# Patient Record
Sex: Male | Born: 1980 | Race: Black or African American | Hispanic: No | Marital: Single | Smoking: Current every day smoker
Health system: Southern US, Community
[De-identification: ages and names within clinical notes are randomized; demographics above are authoritative.]

## PROBLEM LIST (undated history)

## (undated) DIAGNOSIS — E119 Type 2 diabetes mellitus without complications: Secondary | ICD-10-CM

## (undated) HISTORY — PX: NO PAST SURGERIES: SHX2092

---
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
## 2007-05-14 ENCOUNTER — Emergency Department (HOSPITAL_COMMUNITY): Admission: EM | Admit: 2007-05-14 | Discharge: 2007-05-14 | Payer: Self-pay | Admitting: Emergency Medicine

## 2008-05-10 ENCOUNTER — Emergency Department (HOSPITAL_COMMUNITY): Admission: EM | Admit: 2008-05-10 | Discharge: 2008-05-10 | Payer: Self-pay | Admitting: Emergency Medicine

## 2010-01-02 ENCOUNTER — Emergency Department (HOSPITAL_COMMUNITY): Admission: EM | Admit: 2010-01-02 | Discharge: 2010-01-02 | Payer: Self-pay | Admitting: Family Medicine

## 2010-01-09 ENCOUNTER — Emergency Department (HOSPITAL_COMMUNITY): Admission: EM | Admit: 2010-01-09 | Discharge: 2010-01-09 | Payer: Self-pay | Admitting: Family Medicine

## 2010-05-25 ENCOUNTER — Inpatient Hospital Stay (HOSPITAL_COMMUNITY): Admission: EM | Admit: 2010-05-25 | Discharge: 2010-05-26 | Payer: Self-pay | Admitting: Emergency Medicine

## 2010-12-16 LAB — URINALYSIS, ROUTINE W REFLEX MICROSCOPIC
Bilirubin Urine: NEGATIVE
Hgb urine dipstick: NEGATIVE
Nitrite: NEGATIVE
Protein, ur: NEGATIVE mg/dL
Urobilinogen, UA: 1 mg/dL (ref 0.0–1.0)

## 2010-12-16 LAB — HEPATIC FUNCTION PANEL
ALT: 23 U/L (ref 0–53)
Total Bilirubin: 0.9 mg/dL (ref 0.3–1.2)

## 2010-12-16 LAB — DIFFERENTIAL
Basophils Absolute: 0 10*3/uL (ref 0.0–0.1)
Basophils Absolute: 0.2 10*3/uL — ABNORMAL HIGH (ref 0.0–0.1)
Basophils Relative: 1 % (ref 0–1)
Eosinophils Absolute: 0 10*3/uL (ref 0.0–0.7)
Lymphs Abs: 0.6 10*3/uL — ABNORMAL LOW (ref 0.7–4.0)
Monocytes Absolute: 0.6 10*3/uL (ref 0.1–1.0)
Monocytes Relative: 3 % (ref 3–12)
Monocytes Relative: 5 % (ref 3–12)
Neutro Abs: 10.7 10*3/uL — ABNORMAL HIGH (ref 1.7–7.7)
Neutrophils Relative %: 88 % — ABNORMAL HIGH (ref 43–77)
Neutrophils Relative %: 89 % — ABNORMAL HIGH (ref 43–77)

## 2010-12-16 LAB — GLUCOSE, CAPILLARY
Glucose-Capillary: 118 mg/dL — ABNORMAL HIGH (ref 70–99)
Glucose-Capillary: 162 mg/dL — ABNORMAL HIGH (ref 70–99)
Glucose-Capillary: 221 mg/dL — ABNORMAL HIGH (ref 70–99)
Glucose-Capillary: 233 mg/dL — ABNORMAL HIGH (ref 70–99)
Glucose-Capillary: 242 mg/dL — ABNORMAL HIGH (ref 70–99)
Glucose-Capillary: 244 mg/dL — ABNORMAL HIGH (ref 70–99)
Glucose-Capillary: 54 mg/dL — ABNORMAL LOW (ref 70–99)

## 2010-12-16 LAB — CBC
HCT: 40 % (ref 39.0–52.0)
HCT: 45.9 % (ref 39.0–52.0)
Hemoglobin: 13.2 g/dL (ref 13.0–17.0)
Hemoglobin: 15.5 g/dL (ref 13.0–17.0)
MCH: 29.3 pg (ref 26.0–34.0)
MCHC: 33.1 g/dL (ref 30.0–36.0)
MCV: 86.4 fL (ref 78.0–100.0)
RBC: 4.61 MIL/uL (ref 4.22–5.81)
RBC: 5.31 MIL/uL (ref 4.22–5.81)

## 2010-12-16 LAB — CARDIAC PANEL(CRET KIN+CKTOT+MB+TROPI)
CK, MB: 3.2 ng/mL (ref 0.3–4.0)
CK, MB: 3.3 ng/mL (ref 0.3–4.0)
Relative Index: 0.5 (ref 0.0–2.5)
Relative Index: 0.5 (ref 0.0–2.5)
Total CK: 594 U/L — ABNORMAL HIGH (ref 7–232)

## 2010-12-16 LAB — URINE CULTURE

## 2010-12-16 LAB — BASIC METABOLIC PANEL
BUN: 6 mg/dL (ref 6–23)
CO2: 24 mEq/L (ref 19–32)
Chloride: 109 mEq/L (ref 96–112)
Creatinine, Ser: 1.09 mg/dL (ref 0.4–1.5)
GFR calc Af Amer: >60 mL/min (ref >60)
GFR calc non Af Amer: >60 mL/min (ref >60)
Glucose, Bld: 202 mg/dL — ABNORMAL HIGH (ref 70–99)
Potassium: 4.3 mEq/L (ref 3.5–5.1)
Sodium: 139 mEq/L (ref 135–145)

## 2010-12-16 LAB — CK TOTAL AND CKMB (NOT AT ARMC)
CK, MB: 4.4 ng/mL — ABNORMAL HIGH (ref 0.3–4.0)
Relative Index: 0.8 (ref 0.0–2.5)
Total CK: 563 U/L — ABNORMAL HIGH (ref 7–232)

## 2010-12-16 LAB — COMPREHENSIVE METABOLIC PANEL
ALT: 25 U/L (ref 0–53)
AST: 33 U/L (ref 0–37)
Alkaline Phosphatase: 70 U/L (ref 39–117)
BUN: 5 mg/dL — ABNORMAL LOW (ref 6–23)
Chloride: 111 mEq/L (ref 96–112)
Creatinine, Ser: 1.01 mg/dL (ref 0.4–1.5)
Creatinine, Ser: 1.06 mg/dL (ref 0.4–1.5)
GFR calc non Af Amer: >60 mL/min (ref >60)
Glucose, Bld: 138 mg/dL — ABNORMAL HIGH (ref 70–99)
Glucose, Bld: 262 mg/dL — ABNORMAL HIGH (ref 70–99)
Total Protein: 6.4 g/dL (ref 6.0–8.3)

## 2010-12-16 LAB — RAPID URINE DRUG SCREEN, HOSP PERFORMED
Barbiturates: NOT DETECTED
Cocaine: NOT DETECTED

## 2010-12-16 LAB — BLOOD GAS, ARTERIAL: Acid-base deficit: 0.1 mmol/L (ref 0.0–2.0)

## 2010-12-16 LAB — MRSA PCR SCREENING: MRSA by PCR: NEGATIVE

## 2011-06-13 IMAGING — CT CT HEAD W/O CM
1 series · 16 of 30 positions shown, 20 images · non-contrast
Comparison: None available.

CLINICAL DATA: Hypoglycemia.  Headache.  Nausea vomiting

CT HEAD WITHOUT CONTRAST
TECHNIQUE: Contiguous axial images were obtained from the base of
the skull through the vertex without contrast

[Series 2: head_seq 4.5 h37s st · axial · 0.43mm/px · z∈[-171,-27]mm · 16 of 36 slices shown, 20 images]
[im 2/36  brain]
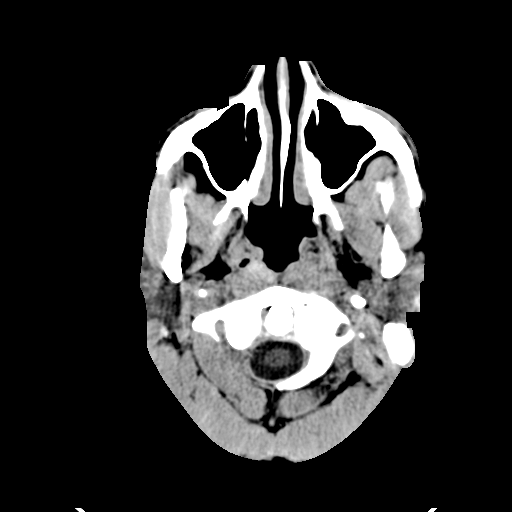
[im 2/36  bone]
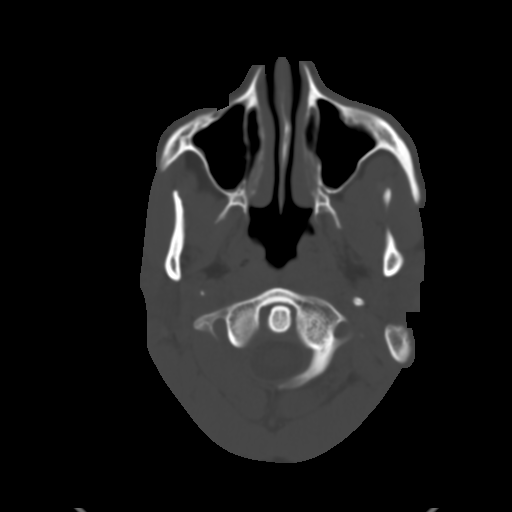
[im 4/36  brain]
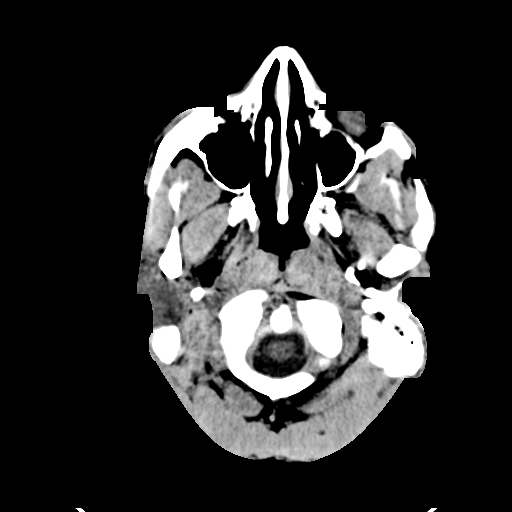
[im 7/36  brain]
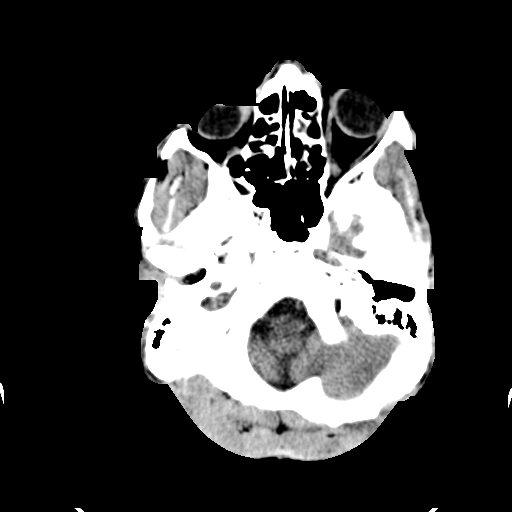
[im 9/36  brain]
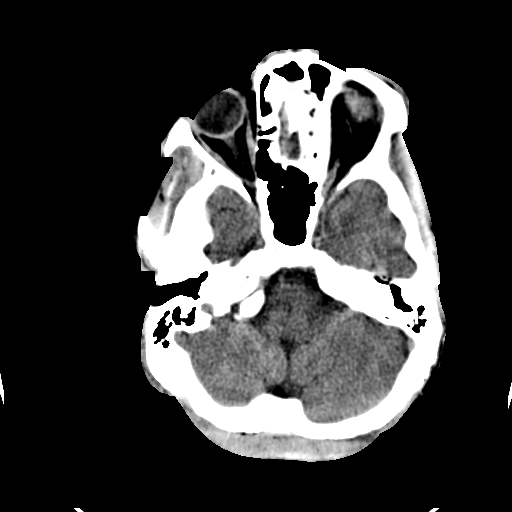
[im 10/36  brain]
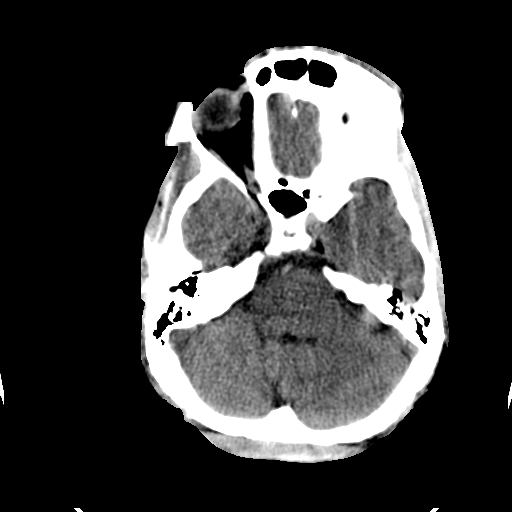
[im 10/36  bone]
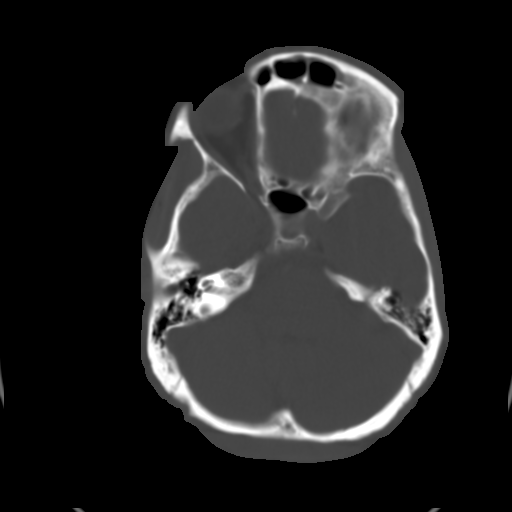
[im 13/36  brain]
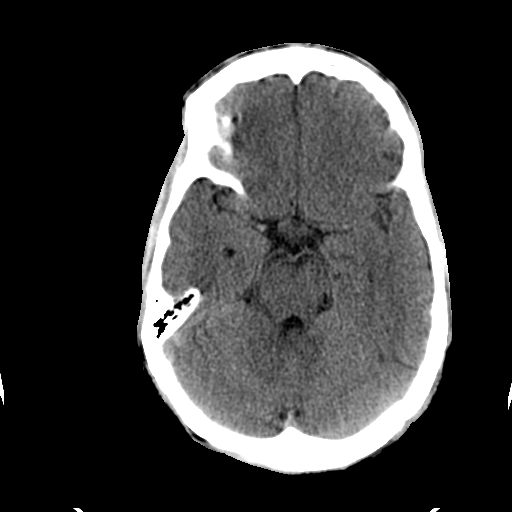
[im 15/36  brain]
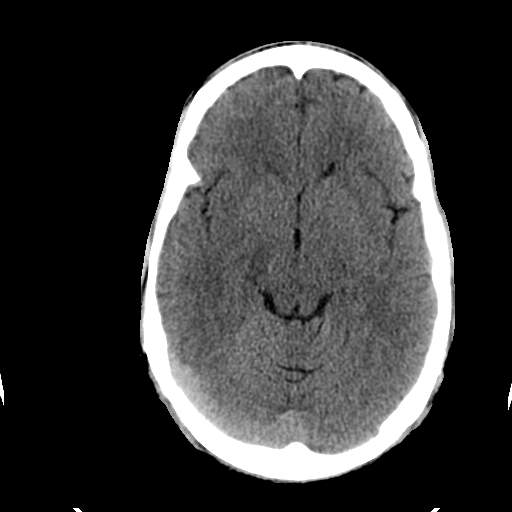
[im 17/36  brain]
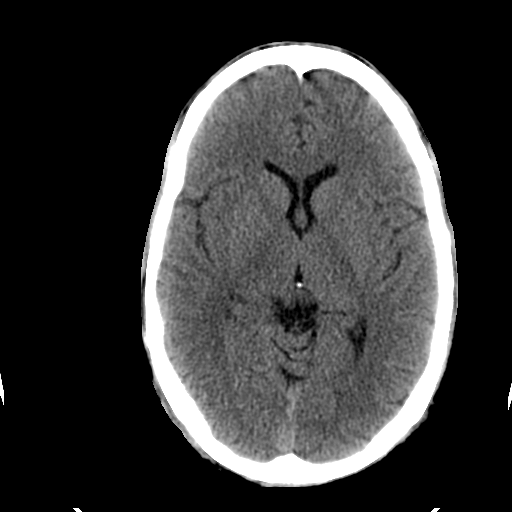
[im 19/36  brain]
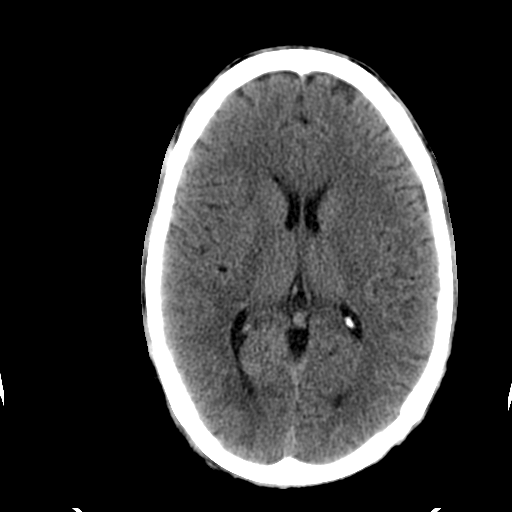
[im 19/36  bone]
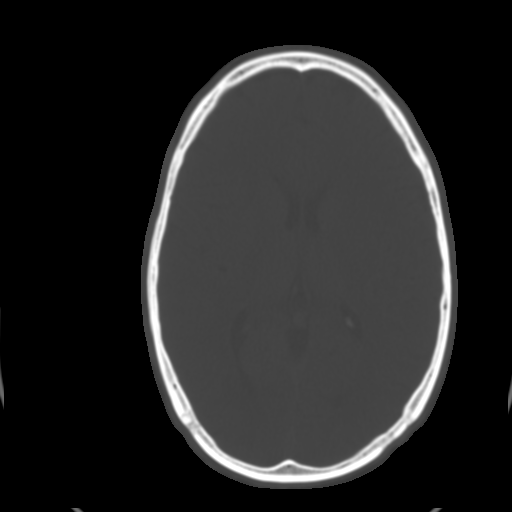
[im 21/36  brain]
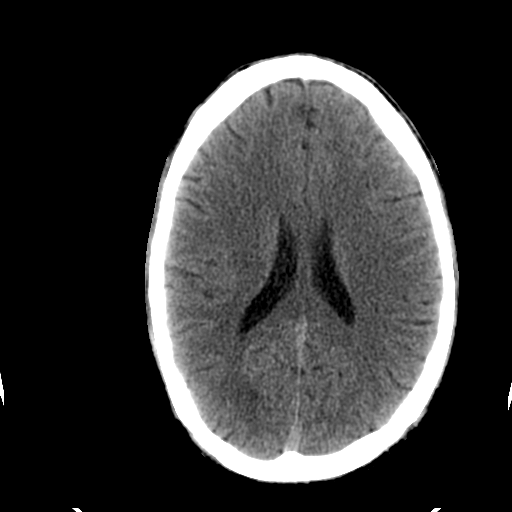
[im 23/36  brain]
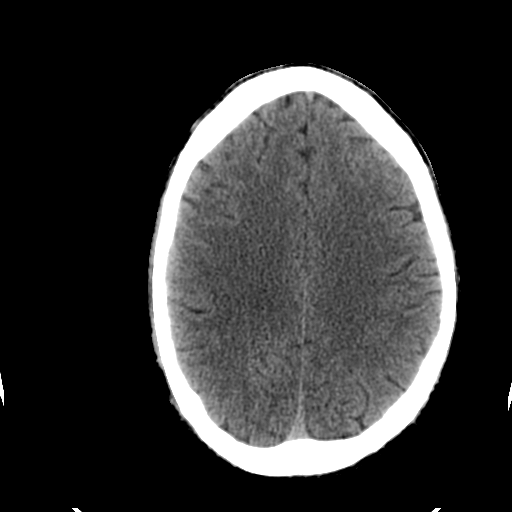
[im 26/36  brain]
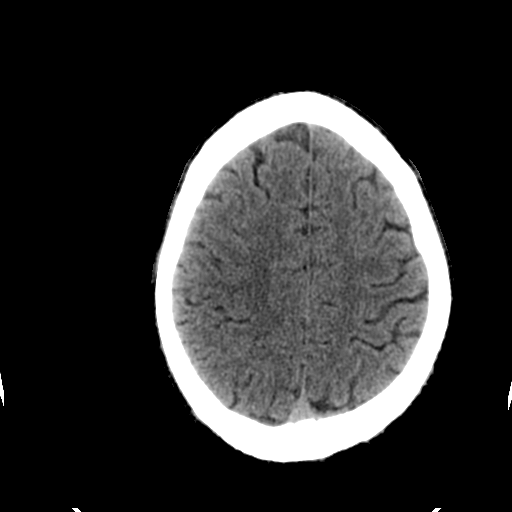
[im 27/36  brain]
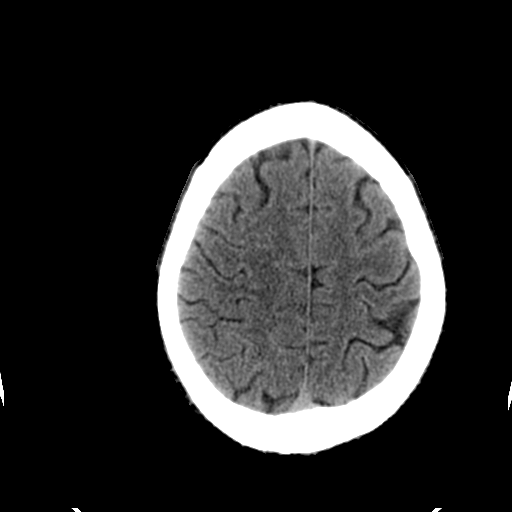
[im 27/36  bone]
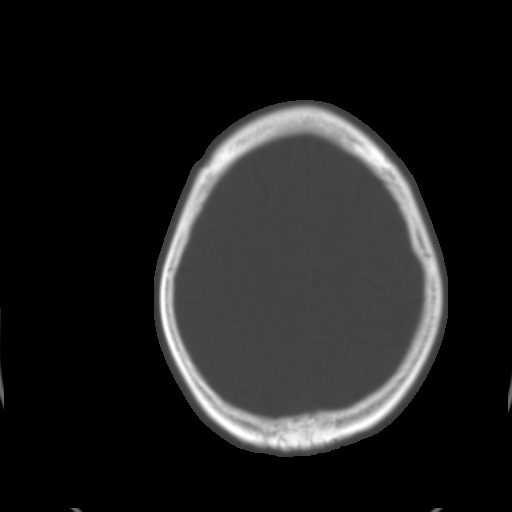
[im 29/36  brain]
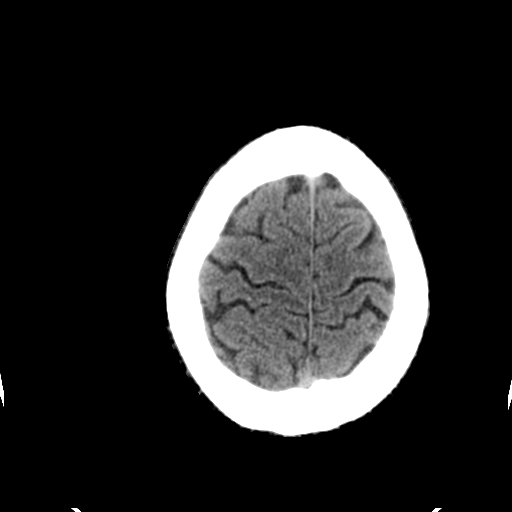
[im 32/36  brain]
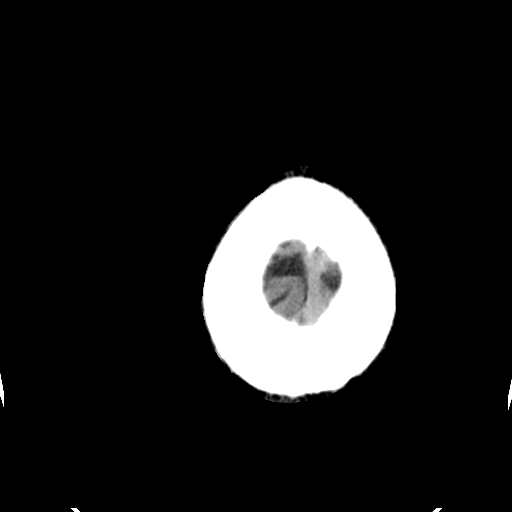
[im 34/36  brain]
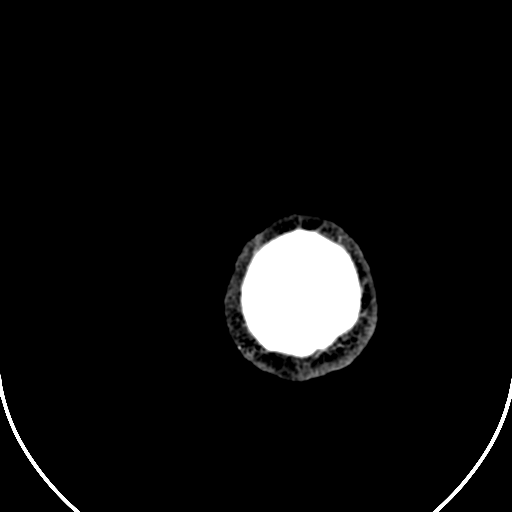

[16 of 30 positions shown; findings below may reference images not displayed]

FINDINGS: The brain has a normal appearance without evidence for
hemorrhage, acute infarction, hydrocephalus, or mass lesion.  There
is no extra axial fluid collection.  The skull and paranasal
sinuses are normal.
IMPRESSION: Normal CT of the head without contrast.

## 2011-06-30 LAB — BLOOD GAS, VENOUS
Acid-Base Excess: 0.1
O2 Saturation: 91.4
Patient temperature: 98.6
pO2, Ven: 61.7 — ABNORMAL HIGH

## 2011-06-30 LAB — GLUCOSE, CAPILLARY: Glucose-Capillary: 201 — ABNORMAL HIGH

## 2011-06-30 LAB — POCT I-STAT, CHEM 8
Calcium, Ion: 1.24
Chloride: 101
Glucose, Bld: 501
HCT: 41
TCO2: 25

## 2011-07-17 LAB — BLOOD GAS, ARTERIAL
Acid-base deficit: 3.2 — ABNORMAL HIGH
Bicarbonate: 20.6
O2 Saturation: 97.9
pO2, Arterial: 105 — ABNORMAL HIGH

## 2011-07-17 LAB — DIFFERENTIAL
Eosinophils Absolute: 0
Eosinophils Relative: 1
Lymphs Abs: 1.2
Monocytes Relative: 4

## 2011-07-17 LAB — BASIC METABOLIC PANEL
BUN: 9
Chloride: 94 — ABNORMAL LOW
GFR calc Af Amer: >60
Potassium: 4.1

## 2011-07-17 LAB — CBC
HCT: 44
MCV: 81.2
RBC: 5.42
WBC: 4.4

## 2014-06-26 ENCOUNTER — Encounter (HOSPITAL_COMMUNITY): Payer: Self-pay | Admitting: Emergency Medicine

## 2014-06-26 ENCOUNTER — Emergency Department (HOSPITAL_COMMUNITY)
Admission: EM | Admit: 2014-06-26 | Discharge: 2014-06-26 | Disposition: A | Discharge status: Home / Self Care | Payer: Self-pay | Attending: Emergency Medicine | Admitting: Emergency Medicine

## 2014-06-26 DIAGNOSIS — E119 Type 2 diabetes mellitus without complications: Secondary | ICD-10-CM | POA: Insufficient documentation

## 2014-06-26 DIAGNOSIS — K089 Disorder of teeth and supporting structures, unspecified: Secondary | ICD-10-CM | POA: Insufficient documentation

## 2014-06-26 DIAGNOSIS — K0889 Other specified disorders of teeth and supporting structures: Secondary | ICD-10-CM

## 2014-06-26 DIAGNOSIS — R51 Headache: Secondary | ICD-10-CM | POA: Insufficient documentation

## 2014-06-26 DIAGNOSIS — K029 Dental caries, unspecified: Secondary | ICD-10-CM | POA: Insufficient documentation

## 2014-06-26 DIAGNOSIS — F17200 Nicotine dependence, unspecified, uncomplicated: Secondary | ICD-10-CM | POA: Insufficient documentation

## 2014-06-26 HISTORY — DX: Type 2 diabetes mellitus without complications: E11.9

## 2014-06-26 MED ORDER — DOXYCYCLINE HYCLATE 100 MG PO CAPS: 100.0000 mg | ORAL_CAPSULE | Freq: Two times a day (BID) | ORAL | Status: DC

## 2014-06-26 MED ORDER — NAPROXEN 500 MG PO TABS: 500.0000 mg | ORAL_TABLET | Freq: Two times a day (BID) | ORAL | Status: DC | PRN

## 2014-06-26 MED ORDER — HYDROCODONE-ACETAMINOPHEN 5-325 MG PO TABS: 1.0000 | ORAL_TABLET | Freq: Four times a day (QID) | ORAL | Status: DC | PRN

## 2014-06-26 NOTE — ED Provider Notes (Signed)
CSN: [id number]     Arrival date & time 06/26/14  1225 History   First MD Initiated Contact with Patient 06/26/14 1243     Chief Complaint  Patient presents with  . Dental Pain     (Consider location/radiation/quality/duration/timing/severity/associated sxs/prior Treatment) HPI Comments: Ruben [name] is a 33 y.o. male with a PMHx of DM type 1, who presents to the ED today complaining of 3 days of gradual onset L lower dental pain in tooth #18. Reports her pain as 10/10 aching constant nonradiating worse with chewing and improved with approximately 12 Goody's powders packs per day. He states the pain was unrelieved with ibuprofen 800 mg and tramadol. Associated symptoms include some facial pain in this area, some swelling with purulent discharge yesterday, and mild headache. Denies fevers, chills, drooling, facial swelling, rhinorrhea, trismus, congestion, sore throat, difficulty swallowing, oral bleeding, trismus, ear pain/discharge, abd pain, n/v/d, paresthesias, weakness, vision changes, or facial droop. States 1 month ago his tooth broke, but he had no pain until 3 days ago. Made a dentist appt for 07/15/14. States his sugars have been "perfect, no highs or lows" and he's compliant with his insulin. Smokes daily. Has not seen a dentist in 1 yr.   Patient is a 33 y.o. male presenting with tooth pain. The history is provided by the patient. No language interpreter was used.  Dental Pain Location:  Lower Lower teeth location:  18/LL 2nd molar Quality:  Aching Severity:  Severe (10/10) Onset quality:  Gradual Duration:  3 days Timing:  Constant Progression:  Unchanged Chronicity:  New Context: dental caries and dental fracture   Relieved by:  NSAIDs (goody's powders) Worsened by:  Touching and pressure Ineffective treatments:  NSAIDs (ibuprofen , tramadol) Associated symptoms: facial pain, gum swelling, headaches and oral lesions   Associated symptoms: no congestion, no difficulty  swallowing, no drooling, no facial swelling, no fever, no neck pain, no neck swelling, no oral bleeding and no trismus   Risk factors: diabetes, lack of dental care, periodontal disease and smoking     Past Medical History  Diagnosis Date  . Diabetes mellitus without complication    History reviewed. No pertinent past surgical history. History reviewed. No pertinent family history. History  Substance Use Topics  . Smoking status: Current Every Day Smoker  . Smokeless tobacco: Not on file  . Alcohol Use: Yes    Review of Systems  Constitutional: Negative for fever and chills.  HENT: Positive for dental problem and mouth sores. Negative for congestion, drooling, ear discharge, ear pain, facial swelling, hearing loss, rhinorrhea, sinus pressure, sore throat, tinnitus and trouble swallowing.   Eyes: Negative for pain and visual disturbance.  Respiratory: Negative for cough and shortness of breath.   Cardiovascular: Negative for chest pain.  Gastrointestinal: Negative for nausea, vomiting, abdominal pain and diarrhea.  Musculoskeletal: Negative for arthralgias, neck pain and neck stiffness.  Skin: Negative for color change.  Neurological: Positive for headaches. Negative for dizziness, syncope, facial asymmetry, weakness, light-headedness and numbness.   10 Systems reviewed and are negative for acute change except as noted in the HPI.    Allergies  Review of patient's allergies indicates no known allergies.  Home Medications   Prior to Admission medications   Not on File   BP 136/83  Pulse 80  Temp(Src) 97.8 F (36.6 C) (Oral)  Resp 12  SpO2 100% Physical Exam  Nursing note and vitals reviewed. Constitutional: He is oriented to person, place, and time. Vital signs are  normal. He appears well-developed and well-nourished.  Non-toxic appearance. No distress.  VSS, afebrile, nontoxic  HENT:  Head: Normocephalic and atraumatic.  Nose: Nose normal.  Mouth/Throat: Uvula is  midline, oropharynx is clear and moist and mucous membranes are normal. No trismus in the jaw. Abnormal dentition. Dental caries present. No dental abscesses or uvula swelling. No oropharyngeal exudate.    Diffuse dental decay, teeth 17-19 fractured and decayed. L lower molar #18 TTP and with mild gingival swelling, no obvious abscess or drainage. No trismus or uvular swelling/deviation. Oropharynx clear without exudates or erythema. Swallowing secretions well, no drooling.  Eyes: Conjunctivae and EOM are normal. Pupils are equal, round, and reactive to light. Right eye exhibits no discharge. Left eye exhibits no discharge.  Neck: Normal range of motion. Neck supple.  Cardiovascular: Normal rate and intact distal pulses.   Pulmonary/Chest: Effort normal. No respiratory distress.  Abdominal: Normal appearance. He exhibits no distension.  Musculoskeletal: Normal range of motion.  Lymphadenopathy:       Head (left side): Submandibular adenopathy present.  Submandibular tender node just below affected tooth, mobile and <1cm  Neurological: He is alert and oriented to person, place, and time. No cranial nerve deficit (CN 5 and 7 intact).  Skin: Skin is warm, dry and intact. No rash noted.  Psychiatric: He has a normal mood and affect.    ED Course  Procedures (including critical care time) Labs Review Labs Reviewed - No data to display  Imaging Review No results found.   EKG Interpretation None      MDM   Final diagnoses:  Pain, dental  Dental decay    32y/o diabetic male with Dental pain associated with dental decay and possible dental abscess with patient afebrile, non toxic appearing and swallowing secretions well. Will treat with abx given that pt is diabetic and smoker, and has some LAD under affected tooth, do not want any possible underlying abscess to worsen. Doubt imaging would change my plan, therefore opted to not obtain dental xrays. Pt has appt with dentist on 07/15/14.  Opted against dental block. I stressed the importance of dental follow up for ultimate management of dental pain, and importance of not smoking.  I have also discussed reasons to return immediately to the ER.  Patient expresses understanding and agrees with plan.  I will also give doxycycline and pain control.   BP 136/83  Pulse 80  Temp(Src) 97.8 F (36.6 C) (Oral)  Resp 12  SpO2 100%  Meds ordered this encounter  Medications  . naproxen (NAPROSYN) 500 MG tablet    Sig: Take 1 tablet (500 mg total) by mouth 2 (two) times daily as needed for mild pain, moderate pain or headache (TAKE WITH MEALS.).    Dispense:  10 tablet    Refill:  0    Order Specific Question:  Supervising Provider    Answer:  Eber Hong D [[id number]]  . HYDROcodone-acetaminophen (NORCO) 5-325 MG per tablet    Sig: Take 1-2 tablets by mouth every 6 (six) hours as needed for severe pain.    Dispense:  10 tablet    Refill:  0    Order Specific Question:  Supervising Provider    Answer:  Eber Hong D [[id number]]  . doxycycline (VIBRAMYCIN) 100 MG capsule    Sig: Take 1 capsule (100 mg total) by mouth 2 (two) times daily. One po bid x 7 days    Dispense:  14 capsule    Refill:  0  Order Specific Question:  Supervising Provider    Answer:  Eber Hong D [id number] [place], PA-C 06/26/14 [phone]

## 2014-06-26 NOTE — Discharge Instructions (Signed)
Apply warm compresses to jaw throughout the day. Take antibiotic until finished and avoid sunlight while taking this medication as it can make your skin very sensitive to the sun. Take naprosyn and norco as directed, as needed for pain but do not drive or operate machinery with pain medication use. Followup with a dentist is very important for ongoing evaluation and management of recurrent dental pain. Return to emergency department for emergent changing or worsening symptoms.   Dental Caries Dental caries (also called tooth decay) is the most common oral disease. It can occur at any age but is more common in children and young adults.  HOW DENTAL CARIES DEVELOPS  The process of decay begins when bacteria and foods (particularly sugars and starches) combine in your mouth to produce plaque. Plaque is a substance that sticks to the hard, outer surface of a tooth (enamel). The bacteria in plaque produce acids that attack enamel. These acids may also attack the root surface of a tooth (cementum) if it is exposed. Repeated attacks dissolve these surfaces and create holes in the tooth (cavities). If left untreated, the acids destroy the other layers of the tooth.  RISK FACTORS  Frequent sipping of sugary beverages.   Frequent snacking on sugary and starchy foods, especially those that easily get stuck in the teeth.   Poor oral hygiene.   Dry mouth.   Substance abuse such as methamphetamine abuse.   Broken or poor-fitting dental restorations.   Eating disorders.   Gastroesophageal reflux disease (GERD).   Certain radiation treatments to the head and neck. SYMPTOMS In the early stages of dental caries, symptoms are seldom present. Sometimes white, chalky areas may be seen on the enamel or other tooth layers. In later stages, symptoms may include:  Pits and holes on the enamel.  Toothache after sweet, hot, or cold foods or drinks are consumed.  Pain around the tooth.  Swelling  around the tooth. DIAGNOSIS  Most of the time, dental caries is detected during a regular dental checkup. A diagnosis is made after a thorough medical and dental history is taken and the surfaces of your teeth are checked for signs of dental caries. Sometimes special instruments, such as lasers, are used to check for dental caries. Dental X-ray exams may be taken so that areas not visible to the eye (such as between the contact areas of the teeth) can be checked for cavities.  TREATMENT  If dental caries is in its early stages, it may be reversed with a fluoride treatment or an application of a remineralizing agent at the dental office. Thorough brushing and flossing at home is needed to aid these treatments. If it is in its later stages, treatment depends on the location and extent of tooth destruction:   If a small area of the tooth has been destroyed, the destroyed area will be removed and cavities will be filled with a material such as gold, silver amalgam, or composite resin.   If a large area of the tooth has been destroyed, the destroyed area will be removed and a cap (crown) will be fitted over the remaining tooth structure.   If the center part of the tooth (pulp) is affected, a procedure called a root canal will be needed before a filling or crown can be placed.   If most of the tooth has been destroyed, the tooth may need to be pulled (extracted). HOME CARE INSTRUCTIONS You can prevent, stop, or reverse dental caries at home by practicing good oral  hygiene. Good oral hygiene includes:  Thoroughly cleaning your teeth at least twice a day with a toothbrush and dental floss.   Using a fluoride toothpaste. A fluoride mouth rinse may also be used if recommended by your dentist or health care provider.   Restricting the amount of sugary and starchy foods and sugary liquids you consume.   Avoiding frequent snacking on these foods and sipping of these liquids.   Keeping regular  visits with a dentist for checkups and cleanings. PREVENTION   Practice good oral hygiene.  Consider a dental sealant. A dental sealant is a coating material that is applied by your dentist to the pits and grooves of teeth. The sealant prevents food from being trapped in them. It may protect the teeth for several years.  Ask about fluoride supplements if you live in a community without fluorinated water or with water that has a low fluoride content. Use fluoride supplements as directed by your dentist or health care provider.  Allow fluoride varnish applications to teeth if directed by your dentist or health care provider. Document Released: 06/10/2002 Document Revised: 02/02/2014 Document Reviewed: 09/20/2012 [facility] Patient Information 2015 [place], [place]. This information is not intended to replace advice given to you by your health care provider. Make sure you discuss any questions you have with your health care provider.  Dental Care and Dentist Visits Dental care supports good overall health. Regular dental visits can also help you avoid dental pain, bleeding, infection, and other more serious health problems in the future. It is important to keep the mouth healthy because diseases in the teeth, gums, and other oral tissues can spread to other areas of the body. Some problems, such as diabetes, heart disease, and pre-term labor have been associated with poor oral health.  See your dentist every 6 months. If you experience emergency problems such as a toothache or broken tooth, go to the dentist right away. If you see your dentist regularly, you may catch problems early. It is easier to be treated for problems in the early stages.  WHAT TO EXPECT AT A DENTIST VISIT  Your dentist will look for many common oral health problems and recommend proper treatment. At your regular dental visit, you can expect:  Gentle cleaning of the teeth and gums. This includes scraping and polishing. This helps  to remove the sticky substance around the teeth and gums (plaque). Plaque forms in the mouth shortly after eating. Over time, plaque hardens on the teeth as tartar. If tartar is not removed regularly, it can cause problems. Cleaning also helps remove stains.  Periodic X-rays. These pictures of the teeth and supporting bone will help your dentist assess the health of your teeth.  Periodic fluoride treatments. Fluoride is a natural mineral shown to help strengthen teeth. Fluoride treatmentinvolves applying a fluoride gel or varnish to the teeth. It is most commonly done in children.  Examination of the mouth, tongue, jaws, teeth, and gums to look for any oral health problems, such as:  Cavities (dental caries). This is decay on the tooth caused by plaque, sugar, and acid in the mouth. It is best to catch a cavity when it is small.  Inflammation of the gums caused by plaque buildup (gingivitis).  Problems with the mouth or malformed or misaligned teeth.  Oral cancer or other diseases of the soft tissues or jaws. KEEP YOUR TEETH AND GUMS HEALTHY For healthy teeth and gums, follow these general guidelines as well as your dentist's specific advice:  Have your teeth professionally cleaned at the dentist every 6 months.  Brush twice daily with a fluoride toothpaste.  Floss your teeth daily.  Ask your dentist if you need fluoride supplements, treatments, or fluoride toothpaste.  Eat a healthy diet. Reduce foods and drinks with added sugar.  Avoid smoking. TREATMENT FOR ORAL HEALTH PROBLEMS If you have oral health problems, treatment varies depending on the conditions present in your teeth and gums.  Your caregiver will most likely recommend good oral hygiene at each visit.  For cavities, gingivitis, or other oral health disease, your caregiver will perform a procedure to treat the problem. This is typically done at a separate appointment. Sometimes your caregiver will refer you to another  dental specialist for specific tooth problems or for surgery. SEEK IMMEDIATE DENTAL CARE IF:  You have pain, bleeding, or soreness in the gum, tooth, jaw, or mouth area.  A permanent tooth becomes loose or separated from the gum socket.  You experience a blow or injury to the mouth or jaw area. Document Released: 05/31/2011 Document Revised: 12/11/2011 Document Reviewed: 05/31/2011 [facility] Patient Information 2015 [place], [place]. This information is not intended to replace advice given to you by your health care provider. Make sure you discuss any questions you have with your health care provider.  Dental Pain A tooth ache may be caused by cavities (tooth decay). Cavities expose the nerve of the tooth to air and hot or cold temperatures. It may come from an infection or abscess (also called a boil or furuncle) around your tooth. It is also often caused by dental caries (tooth decay). This causes the pain you are having. DIAGNOSIS  Your caregiver can diagnose this problem by exam. TREATMENT   If caused by an infection, it may be treated with medications which kill germs (antibiotics) and pain medications as prescribed by your caregiver. Take medications as directed.  Only take over-the-counter or prescription medicines for pain, discomfort, or fever as directed by your caregiver.  Whether the tooth ache today is caused by infection or dental disease, you should see your dentist as soon as possible for further care. SEEK MEDICAL CARE IF: The exam and treatment you received today has been provided on an emergency basis only. This is not a substitute for complete medical or dental care. If your problem worsens or new problems (symptoms) appear, and you are unable to meet with your dentist, call or return to this location. SEEK IMMEDIATE MEDICAL CARE IF:   You have a fever.  You develop redness and swelling of your face, jaw, or neck.  You are unable to open your mouth.  You have severe  pain uncontrolled by pain medicine. MAKE SURE YOU:   Understand these instructions.  Will watch your condition.  Will get help right away if you are not doing well or get worse. Document Released: 09/18/2005 Document Revised: 12/11/2011 Document Reviewed: 05/06/2008 [facility] Patient Information 2015 [place], [place]. This information is not intended to replace advice given to you by your health care provider. Make sure you discuss any questions you have with your health care provider.

## 2014-06-26 NOTE — ED Notes (Signed)
Dental pain-lower left, 2nd from the back.

## 2014-06-26 NOTE — ED Notes (Signed)
Pt c/o tooth pain to lower left molar since Tuesday. Pt states that he has been taking Goody's powder with no relief. Pt reporting blurred vision and trouble sleeping due to pain. Multiple caries noted to lower teeth.

## 2014-06-26 NOTE — ED Provider Notes (Signed)
Medical screening examination/treatment/procedure(s) were performed by non-physician practitioner and as supervising physician I was immediately available for consultation/collaboration.    Vida Roller, MD 06/26/14 [phone]

## 2014-09-18 ENCOUNTER — Encounter (HOSPITAL_COMMUNITY): Payer: Self-pay | Admitting: Emergency Medicine

## 2014-09-18 ENCOUNTER — Emergency Department (HOSPITAL_COMMUNITY)
Admission: EM | Admit: 2014-09-18 | Discharge: 2014-09-19 | Disposition: A | Discharge status: Home / Self Care | Payer: Self-pay | Attending: Emergency Medicine | Admitting: Emergency Medicine

## 2014-09-18 DIAGNOSIS — E1165 Type 2 diabetes mellitus with hyperglycemia: Secondary | ICD-10-CM | POA: Insufficient documentation

## 2014-09-18 DIAGNOSIS — Z720 Tobacco use: Secondary | ICD-10-CM | POA: Insufficient documentation

## 2014-09-18 DIAGNOSIS — R739 Hyperglycemia, unspecified: Secondary | ICD-10-CM

## 2014-09-18 DIAGNOSIS — Z794 Long term (current) use of insulin: Secondary | ICD-10-CM

## 2014-09-18 DIAGNOSIS — R112 Nausea with vomiting, unspecified: Secondary | ICD-10-CM

## 2014-09-18 DIAGNOSIS — IMO0001 Reserved for inherently not codable concepts without codable children: Secondary | ICD-10-CM

## 2014-09-18 DIAGNOSIS — E119 Type 2 diabetes mellitus without complications: Secondary | ICD-10-CM

## 2014-09-18 LAB — COMPREHENSIVE METABOLIC PANEL
ALBUMIN: 4.3 g/dL (ref 3.5–5.2)
ALT: 46 U/L (ref 0–53)
ANION GAP: 18 — AB (ref 5–15)
AST: 34 U/L (ref 0–37)
Alkaline Phosphatase: 126 U/L — ABNORMAL HIGH (ref 39–117)
BUN: 7 mg/dL (ref 6–23)
CALCIUM: 9.7 mg/dL (ref 8.4–10.5)
CO2: 21 mEq/L (ref 19–32)
CREATININE: 0.74 mg/dL (ref 0.50–1.35)
Chloride: 95 mEq/L — ABNORMAL LOW (ref 96–112)
GFR calc Af Amer: >90 mL/min (ref >90)
GFR calc non Af Amer: >90 mL/min (ref >90)
Glucose, Bld: 313 mg/dL — ABNORMAL HIGH (ref 70–99)
Potassium: 3.4 mEq/L — ABNORMAL LOW (ref 3.7–5.3)
Sodium: 134 mEq/L — ABNORMAL LOW (ref 137–147)
Total Bilirubin: 0.6 mg/dL (ref 0.3–1.2)
Total Protein: 7.6 g/dL (ref 6.0–8.3)

## 2014-09-18 LAB — CBC
HEMATOCRIT: 41.9 % (ref 39.0–52.0)
Hemoglobin: 14.5 g/dL (ref 13.0–17.0)
MCH: 27.9 pg (ref 26.0–34.0)
MCHC: 34.6 g/dL (ref 30.0–36.0)
MCV: 80.6 fL (ref 78.0–100.0)
Platelets: 207 10*3/uL (ref 150–400)
RBC: 5.2 MIL/uL (ref 4.22–5.81)
RDW: 13.1 % (ref 11.5–15.5)
WBC: 11.1 10*3/uL — AB (ref 4.0–10.5)

## 2014-09-18 LAB — CBG MONITORING, ED: Glucose-Capillary: 309 mg/dL — ABNORMAL HIGH (ref 70–99)

## 2014-09-18 MED ORDER — INSULIN ASPART 100 UNIT/ML ~~LOC~~ SOLN
10.0000 [IU] | Freq: Once | SUBCUTANEOUS | Status: AC
  Administered 2014-09-18: 10 [IU] via SUBCUTANEOUS
  Filled 2014-09-18: qty 1

## 2014-09-18 MED ORDER — SODIUM CHLORIDE 0.9 % IV BOLUS (SEPSIS)
1000.0000 mL | Freq: Once | INTRAVENOUS | Status: AC
  Administered 2014-09-18: 1000 mL via INTRAVENOUS

## 2014-09-18 MED ORDER — ONDANSETRON HCL 4 MG/2ML IJ SOLN
4.0000 mg | Freq: Once | INTRAMUSCULAR | Status: AC
  Administered 2014-09-18: 4 mg via INTRAVENOUS
  Filled 2014-09-18: qty 2

## 2014-09-18 MED ORDER — PANTOPRAZOLE SODIUM 40 MG IV SOLR
40.0000 mg | Freq: Once | INTRAVENOUS | Status: AC
  Administered 2014-09-18: 40 mg via INTRAVENOUS
  Filled 2014-09-18: qty 40

## 2014-09-18 NOTE — ED Notes (Signed)
Pt. reports persistent nausea and emesis onset today , denies diarrhea , no fever or chills.

## 2014-09-18 NOTE — ED Provider Notes (Signed)
CSN: [id number]     Arrival date & time 09/18/14  2155 History   First MD Initiated Contact with Patient 09/18/14 2211     Chief Complaint  Patient presents with  . Emesis     (Consider location/radiation/quality/duration/timing/severity/associated sxs/prior Treatment) Patient is a 33 y.o. male presenting with vomiting. The history is provided by the patient.  Emesis Associated symptoms: no abdominal pain, no chills, no diarrhea, no headaches and no sore throat   pt with hx iddm, c/o recurrent nv today. Acute onset, episodic, several episodes emesis. Emesis not bloody or bilious. No diarrhea. Had normal bm yesterday. No abd distension or pain. No prior abd surgery. Denies fever or chills. No known bad food ingestion, recent travel, or ill contacts. Compliant w his insulin, no other med use.  States glucose in 300 range today. Denies polyuria or polydipsia. No dysuria or other gu c/o.      Past Medical History  Diagnosis Date  . Diabetes mellitus without complication    History reviewed. No pertinent past surgical history. No family history on file. History  Substance Use Topics  . Smoking status: Current Every Day Smoker  . Smokeless tobacco: Not on file  . Alcohol Use: Yes    Review of Systems  Constitutional: Negative for fever and chills.  HENT: Negative for sore throat.   Eyes: Negative for redness.  Respiratory: Negative for cough and shortness of breath.   Cardiovascular: Negative for chest pain.  Gastrointestinal: Positive for nausea and vomiting. Negative for abdominal pain, diarrhea and constipation.  Genitourinary: Negative for dysuria and flank pain.  Musculoskeletal: Negative for back pain and neck pain.  Skin: Negative for rash.  Neurological: Negative for syncope, light-headedness and headaches.  Hematological: Does not bruise/bleed easily.  Psychiatric/Behavioral: Negative for confusion.      Allergies  Review of patient's allergies indicates no known  allergies.  Home Medications   Prior to Admission medications   Medication Sig Start Date End Date Taking? Authorizing Provider  doxycycline (VIBRAMYCIN) 100 MG capsule Take 1 capsule (100 mg total) by mouth 2 (two) times daily. One po bid x 7 days 06/26/14   Donnita FallsMercedes Strupp Camprubi-Soms, PA-C  HYDROcodone-acetaminophen (NORCO) 5-325 MG per tablet Take 1-2 tablets by mouth every 6 (six) hours as needed for severe pain. 06/26/14   Mercedes Strupp Camprubi-Soms, PA-C  naproxen (NAPROSYN) 500 MG tablet Take 1 tablet (500 mg total) by mouth 2 (two) times daily as needed for mild pain, moderate pain or headache (TAKE WITH MEALS.). 06/26/14   Mercedes Strupp Camprubi-Soms, PA-C   BP 102/63 mmHg  Pulse 70  Temp(Src) 98.7 F (37.1 C) (Oral)  Resp 18  Ht 5\' 10"  (1.778 m)  Wt 150 lb (68.04 kg)  BMI 21.52 kg/m2  SpO2 100% Physical Exam  Constitutional: He is oriented to person, place, and time. He appears well-developed and well-nourished. No distress.  HENT:  Head: Atraumatic.  Mouth/Throat: Oropharynx is clear and moist.  Eyes: Conjunctivae are normal. No scleral icterus.  Neck: Neck supple. No tracheal deviation present.  Cardiovascular: Normal rate, regular rhythm, normal heart sounds and intact distal pulses.  Exam reveals no gallop and no friction rub.   No murmur heard. Pulmonary/Chest: Effort normal and breath sounds normal. No accessory muscle usage. No respiratory distress.  Abdominal: Soft. Bowel sounds are normal. He exhibits no distension and no mass. There is no tenderness. There is no rebound and no guarding.  No hernia  Genitourinary:  No cva tenderness  Musculoskeletal: Normal  range of motion. He exhibits no edema or tenderness.  Neurological: He is alert and oriented to person, place, and time.  Skin: Skin is warm and dry. No rash noted.  Psychiatric: He has a normal mood and affect.  Nursing note and vitals reviewed.   ED Course  Procedures (including critical care  time) Labs Review  Results for orders placed or performed during the hospital encounter of 09/18/14  Comprehensive metabolic panel  Result Value Ref Range   Sodium 134 (L) 137 - 147 mEq/L   Potassium 3.4 (L) 3.7 - 5.3 mEq/L   Chloride 95 (L) 96 - 112 mEq/L   CO2 21 19 - 32 mEq/L   Glucose, Bld 313 (H) 70 - 99 mg/dL   BUN 7 6 - 23 mg/dL   Creatinine, Ser 5.400.74 0.50 - 1.35 mg/dL   Calcium 9.7 8.4 - 98.110.5 mg/dL   Total Protein 7.6 6.0 - 8.3 g/dL   Albumin 4.3 3.5 - 5.2 g/dL   AST 34 0 - 37 U/L   ALT 46 0 - 53 U/L   Alkaline Phosphatase 126 (H) 39 - 117 U/L   Total Bilirubin 0.6 0.3 - 1.2 mg/dL   GFR calc non Af Amer >90 >90 mL/min   GFR calc Af Amer >90 >90 mL/min   Anion gap 18 (H) 5 - 15  CBC  Result Value Ref Range   WBC 11.1 (H) 4.0 - 10.5 K/uL   RBC 5.20 4.22 - 5.81 MIL/uL   Hemoglobin 14.5 13.0 - 17.0 g/dL   HCT 19.141.9 47.839.0 - 29.552.0 %   MCV 80.6 78.0 - 100.0 fL   MCH 27.9 26.0 - 34.0 pg   MCHC 34.6 30.0 - 36.0 g/dL   RDW 62.113.1 30.811.5 - 65.715.5 %   Platelets 207 150 - 400 K/uL     MDM   Iv ns bolus. zofran iv.  Reviewed nursing notes and prior charts for additional history.   Labs.  Pt drinking po fluids. No recurrent nv. abd soft nt.  2 liters ns in ed. subcut insulin.  Pt feels improved.  Pt appears stable for d/c.    Suzi RootsKevin E Amitai Delaughter, MD 09/18/14 [phone]

## 2014-09-19 NOTE — Discharge Instructions (Signed)
It was our pleasure to provide your ER care today - we hope that you feel better.  Rest. Drink plenty of fluids.  Continue insulin. Follow diabetic diet.  Follow up with primary care doctor, or here, in 1 days time for recheck if symptoms fail to improve/resolve.  Return to ER right away if worse, new symptoms, fevers, persistent vomiting, severe abdominal pain, other concern.    Nausea and Vomiting Nausea is a sick feeling that often comes before throwing up (vomiting). Vomiting is a reflex where stomach contents come out of your mouth. Vomiting can cause severe loss of body fluids (dehydration). Children and elderly adults can become dehydrated quickly, especially if they also have diarrhea. Nausea and vomiting are symptoms of a condition or disease. It is important to find the cause of your symptoms. CAUSES   Direct irritation of the stomach lining. This irritation can result from increased acid production (gastroesophageal reflux disease), infection, food poisoning, taking certain medicines (such as nonsteroidal anti-inflammatory drugs), alcohol use, or tobacco use.  Signals from the brain.These signals could be caused by a headache, heat exposure, an inner ear disturbance, increased pressure in the brain from injury, infection, a tumor, or a concussion, pain, emotional stimulus, or metabolic problems.  An obstruction in the gastrointestinal tract (bowel obstruction).  Illnesses such as diabetes, hepatitis, gallbladder problems, appendicitis, kidney problems, cancer, sepsis, atypical symptoms of a heart attack, or eating disorders.  Medical treatments such as chemotherapy and radiation.  Receiving medicine that makes you sleep (general anesthetic) during surgery. DIAGNOSIS Your caregiver may ask for tests to be done if the problems do not improve after a few days. Tests may also be done if symptoms are severe or if the reason for the nausea and vomiting is not clear. Tests may  include:  Urine tests.  Blood tests.  Stool tests.  Cultures (to look for evidence of infection).  X-rays or other imaging studies. Test results can help your caregiver make decisions about treatment or the need for additional tests. TREATMENT You need to stay well hydrated. Drink frequently but in small amounts.You may wish to drink water, sports drinks, clear broth, or eat frozen ice pops or gelatin dessert to help stay hydrated.When you eat, eating slowly may help prevent nausea.There are also some antinausea medicines that may help prevent nausea. HOME CARE INSTRUCTIONS   Take all medicine as directed by your caregiver.  If you do not have an appetite, do not force yourself to eat. However, you must continue to drink fluids.  If you have an appetite, eat a normal diet unless your caregiver tells you differently.  Eat a variety of complex carbohydrates (rice, wheat, potatoes, bread), lean meats, yogurt, fruits, and vegetables.  Avoid high-fat foods because they are more difficult to digest.  Drink enough water and fluids to keep your urine clear or pale yellow.  If you are dehydrated, ask your caregiver for specific rehydration instructions. Signs of dehydration may include:  Severe thirst.  Dry lips and mouth.  Dizziness.  Dark urine.  Decreasing urine frequency and amount.  Confusion.  Rapid breathing or pulse. SEEK IMMEDIATE MEDICAL CARE IF:   You have blood or brown flecks (like coffee grounds) in your vomit.  You have black or bloody stools.  You have a severe headache or stiff neck.  You are confused.  You have severe abdominal pain.  You have chest pain or trouble breathing.  You do not urinate at least once every 8 hours.  You  develop cold or clammy skin.  You continue to vomit for longer than 24 to 48 hours.  You have a fever. MAKE SURE YOU:   Understand these instructions.  Will watch your condition.  Will get help right away if  you are not doing well or get worse. Document Released: 09/18/2005 Document Revised: 12/11/2011 Document Reviewed: 02/15/2011 [facility] Patient Information 2015 [place], [place]. This information is not intended to replace advice given to you by your health care provider. Make sure you discuss any questions you have with your health care provider.    Hyperglycemia Hyperglycemia occurs when the glucose (sugar) in your blood is too high. Hyperglycemia can happen for many reasons, but it most often happens to people who do not know they have diabetes or are not managing their diabetes properly.  CAUSES  Whether you have diabetes or not, there are other causes of hyperglycemia. Hyperglycemia can occur when you have diabetes, but it can also occur in other situations that you might not be as aware of, such as: Diabetes  If you have diabetes and are having problems controlling your blood glucose, hyperglycemia could occur because of some of the following reasons:  Not following your meal plan.  Not taking your diabetes medications or not taking it properly.  Exercising less or doing less activity than you normally do.  Being sick. Pre-diabetes  This cannot be ignored. Before people develop Type 2 diabetes, they almost always have "pre-diabetes." This is when your blood glucose levels are higher than normal, but not yet high enough to be diagnosed as diabetes. Research has shown that some long-term damage to the body, especially the heart and circulatory system, may already be occurring during pre-diabetes. If you take action to manage your blood glucose when you have pre-diabetes, you may delay or prevent Type 2 diabetes from developing. Stress  If you have diabetes, you may be "diet" controlled or on oral medications or insulin to control your diabetes. However, you may find that your blood glucose is higher than usual in the hospital whether you have diabetes or not. This is often referred to as  "stress hyperglycemia." Stress can elevate your blood glucose. This happens because of hormones put out by the body during times of stress. If stress has been the cause of your high blood glucose, it can be followed regularly by your caregiver. That way he/she can make sure your hyperglycemia does not continue to get worse or progress to diabetes. Steroids  Steroids are medications that act on the infection fighting system (immune system) to block inflammation or infection. One side effect can be a rise in blood glucose. Most people can produce enough extra insulin to allow for this rise, but for those who cannot, steroids make blood glucose levels go even higher. It is not unusual for steroid treatments to "uncover" diabetes that is developing. It is not always possible to determine if the hyperglycemia will go away after the steroids are stopped. A special blood test called an A1c is sometimes done to determine if your blood glucose was elevated before the steroids were started. SYMPTOMS  Thirsty.  Frequent urination.  Dry mouth.  Blurred vision.  Tired or fatigue.  Weakness.  Sleepy.  Tingling in feet or leg. DIAGNOSIS  Diagnosis is made by monitoring blood glucose in one or all of the following ways:  A1c test. This is a chemical found in your blood.  Fingerstick blood glucose monitoring.  Laboratory results. TREATMENT  First, knowing the cause  of the hyperglycemia is important before the hyperglycemia can be treated. Treatment may include, but is not be limited to:  Education.  Change or adjustment in medications.  Change or adjustment in meal plan.  Treatment for an illness, infection, etc.  More frequent blood glucose monitoring.  Change in exercise plan.  Decreasing or stopping steroids.  Lifestyle changes. HOME CARE INSTRUCTIONS   Test your blood glucose as directed.  Exercise regularly. Your caregiver will give you instructions about exercise. Pre-diabetes  or diabetes which comes on with stress is helped by exercising.  Eat wholesome, balanced meals. Eat often and at regular, fixed times. Your caregiver or nutritionist will give you a meal plan to guide your sugar intake.  Being at an ideal weight is important. If needed, losing as little as 10 to 15 pounds may help improve blood glucose levels. SEEK MEDICAL CARE IF:   You have questions about medicine, activity, or diet.  You continue to have symptoms (problems such as increased thirst, urination, or weight gain). SEEK IMMEDIATE MEDICAL CARE IF:   You are vomiting or have diarrhea.  Your breath smells fruity.  You are breathing faster or slower.  You are very sleepy or incoherent.  You have numbness, tingling, or pain in your feet or hands.  You have chest pain.  Your symptoms get worse even though you have been following your caregiver's orders.  If you have any other questions or concerns. Document Released: 03/14/2001 Document Revised: 12/11/2011 Document Reviewed: 01/15/2012 [facility] Patient Information 2015 [place]. This information is not intended to replace advice given to you by your health care provider. Make sure you discuss any questions you have with your health care provider.   Diabetes Mellitus and Food It is important for you to manage your blood sugar (glucose) level. Your blood glucose level can be greatly affected by what you eat. Eating healthier foods in the appropriate amounts throughout the day at about the same time each day will help you control your blood glucose level. It can also help slow or prevent worsening of your diabetes mellitus. Healthy eating may even help you improve the level of your blood pressure and reach or maintain a healthy weight.  HOW CAN FOOD AFFECT ME? Carbohydrates Carbohydrates affect your blood glucose level more than any other type of food. Your dietitian will help you determine how many carbohydrates to eat at each meal  and teach you how to count carbohydrates. Counting carbohydrates is important to keep your blood glucose at a healthy level, especially if you are using insulin or taking certain medicines for diabetes mellitus. Alcohol Alcohol can cause sudden decreases in blood glucose (hypoglycemia), especially if you use insulin or take certain medicines for diabetes mellitus. Hypoglycemia can be a life-threatening condition. Symptoms of hypoglycemia (sleepiness, dizziness, and disorientation) are similar to symptoms of having too much alcohol.  If your health care provider has given you approval to drink alcohol, do so in moderation and use the following guidelines:  Women should not have more than one drink per day, and men should not have more than two drinks per day. One drink is equal to:  12 oz of beer.  5 oz of wine.  1 oz of hard liquor.  Do not drink on an empty stomach.  Keep yourself hydrated. Have water, diet soda, or unsweetened iced tea.  Regular soda, juice, and other mixers might contain a lot of carbohydrates and should be counted. WHAT FOODS ARE NOT RECOMMENDED? As you  make food choices, it is important to remember that all foods are not the same. Some foods have fewer nutrients per serving than other foods, even though they might have the same number of calories or carbohydrates. It is difficult to get your body what it needs when you eat foods with fewer nutrients. Examples of foods that you should avoid that are high in calories and carbohydrates but low in nutrients include:  Trans fats (most processed foods list trans fats on the Nutrition Facts label).  Regular soda.  Juice.  Candy.  Sweets, such as cake, pie, doughnuts, and cookies.  Fried foods. WHAT FOODS CAN I EAT? Have nutrient-rich foods, which will nourish your body and keep you healthy. The food you should eat also will depend on several factors, including:  The calories you need.  The medicines you  take.  Your weight.  Your blood glucose level.  Your blood pressure level.  Your cholesterol level. You also should eat a variety of foods, including:  Protein, such as meat, poultry, fish, tofu, nuts, and seeds (lean animal proteins are best).  Fruits.  Vegetables.  Dairy products, such as milk, cheese, and yogurt (low fat is best).  Breads, grains, pasta, cereal, rice, and beans.  Fats such as olive oil, trans fat-free margarine, canola oil, avocado, and olives. DOES EVERYONE WITH DIABETES MELLITUS HAVE THE SAME MEAL PLAN? Because every person with diabetes mellitus is different, there is not one meal plan that works for everyone. It is very important that you meet with a dietitian who will help you create a meal plan that is just right for you. Document Released: 06/15/2005 Document Revised: 09/23/2013 Document Reviewed: 08/15/2013 [facility] Patient Information 2015 [place], [place]. This information is not intended to replace advice given to you by your health care provider. Make sure you discuss any questions you have with your health care provider.

## 2014-09-19 NOTE — ED Notes (Signed)
Pt. Left with all belongings 

## 2014-11-21 ENCOUNTER — Inpatient Hospital Stay (HOSPITAL_COMMUNITY)
Admission: EM | Admit: 2014-11-21 | Discharge: 2014-11-23 | DRG: 638 | Disposition: A | Discharge status: Home / Self Care | Payer: Self-pay | Attending: Internal Medicine | Admitting: Internal Medicine

## 2014-11-21 ENCOUNTER — Encounter (HOSPITAL_COMMUNITY): Payer: Self-pay | Admitting: Adult Health

## 2014-11-21 ENCOUNTER — Inpatient Hospital Stay (HOSPITAL_COMMUNITY): Payer: Self-pay

## 2014-11-21 DIAGNOSIS — N179 Acute kidney failure, unspecified: Secondary | ICD-10-CM | POA: Diagnosis present

## 2014-11-21 DIAGNOSIS — R112 Nausea with vomiting, unspecified: Secondary | ICD-10-CM | POA: Diagnosis present

## 2014-11-21 DIAGNOSIS — R0602 Shortness of breath: Secondary | ICD-10-CM

## 2014-11-21 DIAGNOSIS — Z720 Tobacco use: Secondary | ICD-10-CM | POA: Diagnosis present

## 2014-11-21 DIAGNOSIS — E1110 Type 2 diabetes mellitus with ketoacidosis without coma: Secondary | ICD-10-CM | POA: Diagnosis present

## 2014-11-21 DIAGNOSIS — E1310 Other specified diabetes mellitus with ketoacidosis without coma: Secondary | ICD-10-CM

## 2014-11-21 DIAGNOSIS — E1010 Type 1 diabetes mellitus with ketoacidosis without coma: Principal | ICD-10-CM | POA: Diagnosis present

## 2014-11-21 DIAGNOSIS — F17210 Nicotine dependence, cigarettes, uncomplicated: Secondary | ICD-10-CM | POA: Diagnosis present

## 2014-11-21 DIAGNOSIS — Z794 Long term (current) use of insulin: Secondary | ICD-10-CM

## 2014-11-21 DIAGNOSIS — E860 Dehydration: Secondary | ICD-10-CM | POA: Diagnosis present

## 2014-11-21 DIAGNOSIS — D72829 Elevated white blood cell count, unspecified: Secondary | ICD-10-CM | POA: Diagnosis present

## 2014-11-21 LAB — COMPREHENSIVE METABOLIC PANEL
ALBUMIN: 4.5 g/dL (ref 3.5–5.2)
ALT: 50 U/L (ref 0–53)
AST: 37 U/L (ref 0–37)
Alkaline Phosphatase: 109 U/L (ref 39–117)
Anion gap: 21 — ABNORMAL HIGH (ref 5–15)
BUN: 18 mg/dL (ref 6–23)
CO2: 14 mmol/L — AB (ref 19–32)
Calcium: 9.7 mg/dL (ref 8.4–10.5)
Chloride: 99 mmol/L (ref 96–112)
Creatinine, Ser: 1.4 mg/dL — ABNORMAL HIGH (ref 0.50–1.35)
GFR, EST AFRICAN AMERICAN: 76 mL/min — AB (ref >90)
GFR, EST NON AFRICAN AMERICAN: 66 mL/min — AB (ref >90)
Glucose, Bld: 285 mg/dL — ABNORMAL HIGH (ref 70–99)
POTASSIUM: 4.5 mmol/L (ref 3.5–5.1)
SODIUM: 134 mmol/L — AB (ref 135–145)
Total Bilirubin: 1.6 mg/dL — ABNORMAL HIGH (ref 0.3–1.2)
Total Protein: 7.7 g/dL (ref 6.0–8.3)

## 2014-11-21 LAB — URINALYSIS, ROUTINE W REFLEX MICROSCOPIC
BILIRUBIN URINE: NEGATIVE
Hgb urine dipstick: NEGATIVE
Ketones, ur: >80 mg/dL — AB
Leukocytes, UA: NEGATIVE
NITRITE: NEGATIVE
Protein, ur: NEGATIVE mg/dL
SPECIFIC GRAVITY, URINE: 1.029 (ref 1.005–1.030)
Urobilinogen, UA: 0.2 mg/dL (ref 0.0–1.0)
pH: 5 (ref 5.0–8.0)

## 2014-11-21 LAB — RAPID URINE DRUG SCREEN, HOSP PERFORMED
Amphetamines: NOT DETECTED
BENZODIAZEPINES: NOT DETECTED
Barbiturates: NOT DETECTED
Cocaine: NOT DETECTED
Opiates: NOT DETECTED
Tetrahydrocannabinol: NOT DETECTED

## 2014-11-21 LAB — TSH: TSH: 1.406 u[IU]/mL (ref 0.350–4.500)

## 2014-11-21 LAB — CBC
HEMATOCRIT: 46.2 % (ref 39.0–52.0)
HEMOGLOBIN: 16.2 g/dL (ref 13.0–17.0)
MCH: 29.6 pg (ref 26.0–34.0)
MCHC: 35.1 g/dL (ref 30.0–36.0)
MCV: 84.5 fL (ref 78.0–100.0)
PLATELETS: 99 10*3/uL — AB (ref 150–400)
RBC: 5.47 MIL/uL (ref 4.22–5.81)
RDW: 13.5 % (ref 11.5–15.5)
WBC: 19.4 10*3/uL — AB (ref 4.0–10.5)

## 2014-11-21 LAB — CBG MONITORING, ED
Glucose-Capillary: 237 mg/dL — ABNORMAL HIGH (ref 70–99)
Glucose-Capillary: 229 mg/dL — ABNORMAL HIGH (ref 70–99)

## 2014-11-21 LAB — URINE MICROSCOPIC-ADD ON: URINE-OTHER: NONE SEEN

## 2014-11-21 LAB — GLUCOSE, CAPILLARY: Glucose-Capillary: 311 mg/dL — ABNORMAL HIGH (ref 70–99)

## 2014-11-21 LAB — D-DIMER, QUANTITATIVE (NOT AT ARMC): D DIMER QUANT: 0.28 ug{FEU}/mL (ref 0.00–0.48)

## 2014-11-21 MED ORDER — SODIUM CHLORIDE 0.9 % IV SOLN: INTRAVENOUS | Status: DC

## 2014-11-21 MED ORDER — SODIUM CHLORIDE 0.9 % IV SOLN
INTRAVENOUS | Status: DC
  Administered 2014-11-21: 2.7 [IU]/h via INTRAVENOUS
  Filled 2014-11-21: qty 2.5

## 2014-11-21 MED ORDER — SODIUM CHLORIDE 0.9 % IV BOLUS (SEPSIS)
1000.0000 mL | Freq: Once | INTRAVENOUS | Status: AC
  Administered 2014-11-21: 1000 mL via INTRAVENOUS

## 2014-11-21 MED ORDER — DEXTROSE-NACL 5-0.45 % IV SOLN
INTRAVENOUS | Status: DC
  Administered 2014-11-22: 01:00:00 via INTRAVENOUS

## 2014-11-21 MED ORDER — SODIUM CHLORIDE 0.9 % IV SOLN
INTRAVENOUS | Status: DC
  Filled 2014-11-21 (×2): qty 2.5

## 2014-11-21 MED ORDER — ENOXAPARIN SODIUM 40 MG/0.4ML ~~LOC~~ SOLN
40.0000 mg | Freq: Every day | SUBCUTANEOUS | Status: DC
  Administered 2014-11-21 – 2014-11-22 (×2): 40 mg via SUBCUTANEOUS
  Filled 2014-11-21 (×3): qty 0.4

## 2014-11-21 MED ORDER — SODIUM CHLORIDE 0.9 % IV SOLN
INTRAVENOUS | Status: DC
  Administered 2014-11-21: 23:00:00 via INTRAVENOUS

## 2014-11-21 MED ORDER — POTASSIUM CHLORIDE 10 MEQ/100ML IV SOLN
10.0000 meq | INTRAVENOUS | Status: AC
  Administered 2014-11-22 (×2): 10 meq via INTRAVENOUS
  Filled 2014-11-21 (×2): qty 100

## 2014-11-21 NOTE — ED Notes (Signed)
Renee, RN on 3S advised pt has not been placed on glucose stabilizer since it has not been received from pharmacy and also that pts last blood sugar was 237. Renee, RN advised she would page admitting physician in regards to this.

## 2014-11-21 NOTE — ED Provider Notes (Signed)
CSN: [id number]     Arrival date & time 11/21/14  1720 History   First MD Initiated Contact with Patient 11/21/14 1746     Chief Complaint  Patient presents with  . Hyperglycemia     (Consider location/radiation/quality/duration/timing/severity/associated sxs/prior Treatment) HPI Comments: Patient is a 34 year old male with history of type 1 diabetes diagnosed at age 34. He presents with complaints of high blood sugar, weakness, excessive thirst and urination for the past several days. [facility] He reports to me that he is taking his insulin as he should and denies eating any foods rich in sugars or carbohydrates. He denies any fevers or chills. He denies any abdominal pain. He does admit to nausea and vomiting.  Patient is a 34 y.o. male presenting with hyperglycemia. The history is provided by the patient.  Hyperglycemia Blood sugar level PTA:  350 Severity:  Moderate Onset quality:  Gradual Timing:  Constant Progression:  Worsening Chronicity:  Recurrent Diabetes status:  Controlled with insulin Relieved by:  Nothing Ineffective treatments:  None tried Associated symptoms: polyuria   Associated symptoms: no abdominal pain and no dysuria     Past Medical History  Diagnosis Date  . Diabetes mellitus without complication    History reviewed. No pertinent past surgical history. History reviewed. No pertinent family history. History  Substance Use Topics  . Smoking status: Current Every Day Smoker  . Smokeless tobacco: Not on file  . Alcohol Use: Yes    Review of Systems  Gastrointestinal: Negative for abdominal pain.  Endocrine: Positive for polyuria.  Genitourinary: Negative for dysuria.  All other systems reviewed and are negative.     Allergies  Review of patient's allergies indicates no known allergies.  Home Medications   Prior to Admission medications   Medication Sig Start Date End Date  Taking? Authorizing Provider  doxycycline (VIBRAMYCIN) 100 MG capsule Take 1 capsule (100 mg total) by mouth 2 (two) times daily. One po bid x 7 days Patient not taking: Reported on 09/18/2014 06/26/14   Donnita FallsMercedes Strupp Camprubi-Soms, PA-C  HYDROcodone-acetaminophen (NORCO) 5-325 MG per tablet Take 1-2 tablets by mouth every 6 (six) hours as needed for severe pain. Patient not taking: Reported on 09/18/2014 06/26/14   Donnita FallsMercedes Strupp Camprubi-Soms, PA-C  insulin aspart (NOVOLOG) 100 UNIT/ML injection Inject 10 Units into the skin 3 (three) times daily before meals.    Historical Provider, MD  insulin detemir (LEVEMIR) 100 UNIT/ML injection Inject 50 Units into the skin 2 (two) times daily.    Historical Provider, MD  naproxen (NAPROSYN) 500 MG tablet Take 1 tablet (500 mg total) by mouth 2 (two) times daily as needed for mild pain, moderate pain or headache (TAKE WITH MEALS.). Patient not taking: Reported on 09/18/2014 06/26/14   Donnita FallsMercedes Strupp Camprubi-Soms, PA-C   BP 147/100 mmHg  Pulse 67  Temp(Src) 98.5 F (36.9 C) (Oral)  Resp 18  SpO2 100% Physical Exam  Constitutional: He is oriented to person, place, and time. He appears well-developed and well-nourished. No distress.  HENT:  Head: Normocephalic and atraumatic.  Mucous membranes are somewhat dry.  Eyes: EOM are normal. Pupils are equal, round, and reactive to light.  Neck: Normal range of motion. Neck supple.  Cardiovascular: Normal rate, regular rhythm and normal heart sounds.   No murmur heard. Pulmonary/Chest: Effort normal and breath sounds normal. No respiratory distress. He has no wheezes.  Abdominal: Soft. Bowel sounds are normal. He exhibits no distension. There is  no tenderness.  Musculoskeletal: Normal range of motion. He exhibits no edema.  Lymphadenopathy:    He has no cervical adenopathy.  Neurological: He is alert and oriented to person, place, and time. No cranial nerve deficit.  Skin: Skin is warm and dry. He is  not diaphoretic.  Nursing note and vitals reviewed.   ED Course  Procedures (including critical care time) Labs Review Labs Reviewed  CBC  COMPREHENSIVE METABOLIC PANEL  URINALYSIS, ROUTINE W REFLEX MICROSCOPIC  CBG MONITORING, ED    Imaging Review No results found.   EKG Interpretation None      MDM   Final diagnoses:  None    Patient is a 34 year old male with history of insulin-dependent diabetes. He presents with complaints of elevated blood sugars, weakness, excessive thirst and urination. Workup reveals a white count of 19,000, CO2 of 14, and anion gap of 21. His sugars 285. This is reflective of diabetic ketoacidosis and he was started on the glucose stabilizer. I've discussed this with Dr. Toniann Fail from the hospitalist service who agrees to admit.  CRITICAL CARE Performed by: Geoffery Lyons Total critical care time: 45 minutes Critical care time was exclusive of separately billable procedures and treating other patients. Critical care was necessary to treat or prevent imminent or life-threatening deterioration. Critical care was time spent personally by me on the following activities: development of treatment plan with patient and/or surrogate as well as nursing, discussions with consultants, evaluation of patient's response to treatment, examination of patient, obtaining history from patient or surrogate, ordering and performing treatments and interventions, ordering and review of laboratory studies, ordering and review of radiographic studies, pulse oximetry and re-evaluation of patient's condition.     Geoffery Lyons, MD 11/21/14 [phone]

## 2014-11-21 NOTE — H&P (Signed)
[facility] History and Physical  Ruben [name] ZOX:[id number] DOB: [birth year] DOA: 11/21/2014  Referring physician: ER physician. PCP: No primary care provider on file.   Chief Complaint: Elevated blood sugar.  HPI: Ruben [name] is a 34 y.o. male with history of diabetes mellitus type 1 present to the ER because of elevated blood sugar. Patient stated that since morning his blood sugar has been running high and also initially early in the morning patient also had 2-3 episodes of nausea and vomiting with frequent urination. He also felt mildly short of breath but denies any chest pain palpitation dizziness headache visual symptoms or loss of consciousness. Patient states his blood sugar went up and go 500 and in the ER was found to be around 285 with anion gap and low bicarbonate. Patient has been started on IV insulin infusion and admitted for diabetic ketoacidosis. Patient states he has been compliant with his medications. On exam patient abdomen appears benign. Patient is afebrile.   Review of Systems: As presented in the history of presenting illness, rest negative.  Past Medical History  Diagnosis Date  . Diabetes mellitus without complication    Past Surgical History  Procedure Laterality Date  . No past surgeries     Social History:  reports that he has been smoking.  He does not have any smokeless tobacco history on file. He reports that he drinks alcohol. He reports that he does not use illicit drugs. Where does patient live at home. Can patient participate in ADLs? Yes.  No Known Allergies  Family History:  Family History  Problem Relation Age of Onset  . Diabetes Mellitus II Maternal Grandmother       Prior to Admission medications   Medication Sig Start Date End Date Taking? Authorizing Provider  insulin detemir (LEVEMIR) 100 UNIT/ML injection Inject 50 Units into the skin 2 (two) times daily.   Yes Historical Provider, MD  insulin regular (NOVOLIN  R,HUMULIN R) 100 units/mL injection Inject 10 Units into the skin 3 (three) times daily before meals.   Yes Historical Provider, MD  doxycycline (VIBRAMYCIN) 100 MG capsule Take 1 capsule (100 mg total) by mouth 2 (two) times daily. One po bid x 7 days Patient not taking: Reported on 09/18/2014 06/26/14   Donnita Falls Camprubi-Soms, PA-C  HYDROcodone-acetaminophen (NORCO) 5-325 MG per tablet Take 1-2 tablets by mouth every 6 (six) hours as needed for severe pain. Patient not taking: Reported on 09/18/2014 06/26/14   Donnita Falls Camprubi-Soms, PA-C  naproxen (NAPROSYN) 500 MG tablet Take 1 tablet (500 mg total) by mouth 2 (two) times daily as needed for mild pain, moderate pain or headache (TAKE WITH MEALS.). Patient not taking: Reported on 09/18/2014 06/26/14   Donnita Falls Camprubi-Soms, PA-C    Physical Exam: Filed Vitals:   11/21/14 1935 11/21/14 1945 11/21/14 2000 11/21/14 2030  BP: 138/86 119/78 132/76 119/73  Pulse: 79 86 79 84  Temp:      TempSrc:      Resp: SpO2: 100% 98% 100% 99%     General:  Moderately built and nourished.  Eyes: Anicteric no pallor.  ENT: No discharge from ears eyes nose and mouth.  Neck: No mass felt.  Cardiovascular: S1-S2 heard.  Respiratory: No rhonchi or crepitations.  Abdomen: Soft nontender bowel sounds present.  Skin: No rash.  Musculoskeletal: No edema.  Psychiatric: Appears normal.  Neurologic: Alert awake oriented to time place and person. Moves all extremities.  Labs on Admission:  Basic Metabolic Panel:  Recent Labs Lab 11/21/14 1746  NA 134*  K 4.5  CL 99  CO2 14*  GLUCOSE 285*  BUN 18  CREATININE 1.40*  CALCIUM 9.7   Liver Function Tests:  Recent Labs Lab 11/21/14 1746  AST 37  ALT 50  ALKPHOS 109  BILITOT 1.6*  PROT 7.7  ALBUMIN 4.5   No results for input(s): LIPASE, AMYLASE in the last 168 hours. No results for input(s): AMMONIA in the last 168 hours. CBC:  Recent Labs Lab  11/21/14 1746  WBC 19.4*  HGB 16.2  HCT 46.2  MCV 84.5  PLT 99*   Cardiac Enzymes: No results for input(s): CKTOTAL, CKMB, CKMBINDEX, TROPONINI in the last 168 hours.  BNP (last 3 results) No results for input(s): BNP in the last 8760 hours.  ProBNP (last 3 results) No results for input(s): PROBNP in the last 8760 hours.  CBG:  Recent Labs Lab 11/21/14 1817 11/21/14 2039  GLUCAP 237* 229*    Radiological Exams on Admission: No results found.   Assessment/Plan Active Problems:   Diabetic ketoacidosis   Acute renal failure   DKA (diabetic ketoacidoses)   Diabetic ketoacidosis without coma associated with other specified diabetes mellitus   1. Diabetic ketoacidosis - precipitating cause not clear. At this time we will check hemoglobin A1c. Patient has been started on IV insulin infusion and IV fluids and will change to serpiginous insulin once patient's anion gap that's corrected. Closely follow metabolic panel. Troponin and d-dimer chest x-ray are pending. 2. Nausea vomiting - probably from gastroparesis versus gastroenteritis total bilirubin is mildly elevated but AST ALT and alkaline phosphatase was normal and patient's abdomen appears benign. Repeat LFTs. Patient states his nausea vomiting is resolved. Closely observe. 3. Acute renal failure - probably from dehydration. Closely follow intake output metabolic panel. 4. Tobacco abuse - tobacco cessation counseling requested. 5. Leukocytosis - patient is afebrile at this time. Closely follow CBC.   DVT Prophylaxis Lovenox.  Code Status: Full code.  Family Communication: None.  Disposition Plan: Admit to inpatient.    Fotini Lemus N. [facility] Pager [phone].  If 7PM-7AM, please contact night-coverage [email or web address] Password [email or web address] 11/21/2014, 9:43 PM

## 2014-11-21 NOTE — ED Notes (Signed)
Blood glucose - 229

## 2014-11-21 NOTE — ED Notes (Signed)
Presents with hyperglycemia began 2-3 days ago-recent hospitalization last week, endorses polydipsia, polyuria and weakness. Type 1 diabetic

## 2014-11-22 DIAGNOSIS — R0602 Shortness of breath: Secondary | ICD-10-CM

## 2014-11-22 DIAGNOSIS — Z720 Tobacco use: Secondary | ICD-10-CM | POA: Diagnosis present

## 2014-11-22 DIAGNOSIS — R112 Nausea with vomiting, unspecified: Secondary | ICD-10-CM | POA: Diagnosis present

## 2014-11-22 LAB — TROPONIN I: Troponin I: 0.03 ng/mL (ref <0.031)

## 2014-11-22 LAB — BASIC METABOLIC PANEL
Anion gap: 18 — ABNORMAL HIGH (ref 5–15)
Anion gap: 10 (ref 5–15)
Anion gap: 10 (ref 5–15)
BUN: 16 mg/dL (ref 6–23)
BUN: 17 mg/dL (ref 6–23)
BUN: 12 mg/dL (ref 6–23)
CO2: 16 mmol/L — ABNORMAL LOW (ref 19–32)
CO2: 20 mmol/L (ref 19–32)
CO2: 22 mmol/L (ref 19–32)
CREATININE: 1.37 mg/dL — AB (ref 0.50–1.35)
CREATININE: 1.06 mg/dL (ref 0.50–1.35)
Calcium: 9.2 mg/dL (ref 8.4–10.5)
Calcium: 9 mg/dL (ref 8.4–10.5)
Calcium: 8.8 mg/dL (ref 8.4–10.5)
Chloride: 100 mmol/L (ref 96–112)
Chloride: 105 mmol/L (ref 96–112)
Chloride: 105 mmol/L (ref 96–112)
Creatinine, Ser: 1.21 mg/dL (ref 0.50–1.35)
GFR calc Af Amer: 78 mL/min — ABNORMAL LOW (ref >90)
GFR calc Af Amer: >90 mL/min (ref >90)
GFR calc non Af Amer: 68 mL/min — ABNORMAL LOW (ref >90)
GFR calc non Af Amer: >90 mL/min (ref >90)
GFR, EST NON AFRICAN AMERICAN: 78 mL/min — AB (ref >90)
Glucose, Bld: 349 mg/dL — ABNORMAL HIGH (ref 70–99)
Glucose, Bld: 183 mg/dL — ABNORMAL HIGH (ref 70–99)
Glucose, Bld: 138 mg/dL — ABNORMAL HIGH (ref 70–99)
POTASSIUM: 4.2 mmol/L (ref 3.5–5.1)
Potassium: 4.8 mmol/L (ref 3.5–5.1)
Potassium: 3.9 mmol/L (ref 3.5–5.1)
Sodium: 134 mmol/L — ABNORMAL LOW (ref 135–145)
Sodium: 135 mmol/L (ref 135–145)
Sodium: 137 mmol/L (ref 135–145)

## 2014-11-22 LAB — CBC WITH DIFFERENTIAL/PLATELET
BASOS PCT: 0 % (ref 0–1)
Basophils Absolute: 0 10*3/uL (ref 0.0–0.1)
EOS ABS: 0 10*3/uL (ref 0.0–0.7)
Eosinophils Relative: 0 % (ref 0–5)
HEMATOCRIT: 38.3 % — AB (ref 39.0–52.0)
HEMOGLOBIN: 13.2 g/dL (ref 13.0–17.0)
Lymphocytes Relative: 12 % (ref 12–46)
Lymphs Abs: 2.1 10*3/uL (ref 0.7–4.0)
MCH: 27.7 pg (ref 26.0–34.0)
MCHC: 34.5 g/dL (ref 30.0–36.0)
MCV: 80.5 fL (ref 78.0–100.0)
MONO ABS: 1.2 10*3/uL — AB (ref 0.1–1.0)
MONOS PCT: 7 % (ref 3–12)
NEUTROS ABS: 14.4 10*3/uL — AB (ref 1.7–7.7)
Neutrophils Relative %: 81 % — ABNORMAL HIGH (ref 43–77)
Platelets: 236 10*3/uL (ref 150–400)
RBC: 4.76 MIL/uL (ref 4.22–5.81)
RDW: 13.1 % (ref 11.5–15.5)
WBC: 17.7 10*3/uL — AB (ref 4.0–10.5)

## 2014-11-22 LAB — GLUCOSE, CAPILLARY
GLUCOSE-CAPILLARY: 122 mg/dL — AB (ref 70–99)
GLUCOSE-CAPILLARY: 180 mg/dL — AB (ref 70–99)
GLUCOSE-CAPILLARY: 61 mg/dL — AB (ref 70–99)
Glucose-Capillary: 110 mg/dL — ABNORMAL HIGH (ref 70–99)
Glucose-Capillary: 129 mg/dL — ABNORMAL HIGH (ref 70–99)
Glucose-Capillary: 131 mg/dL — ABNORMAL HIGH (ref 70–99)
Glucose-Capillary: 135 mg/dL — ABNORMAL HIGH (ref 70–99)
Glucose-Capillary: 153 mg/dL — ABNORMAL HIGH (ref 70–99)
Glucose-Capillary: 165 mg/dL — ABNORMAL HIGH (ref 70–99)
Glucose-Capillary: 167 mg/dL — ABNORMAL HIGH (ref 70–99)
Glucose-Capillary: 190 mg/dL — ABNORMAL HIGH (ref 70–99)
Glucose-Capillary: 192 mg/dL — ABNORMAL HIGH (ref 70–99)
Glucose-Capillary: 194 mg/dL — ABNORMAL HIGH (ref 70–99)
Glucose-Capillary: 295 mg/dL — ABNORMAL HIGH (ref 70–99)
Glucose-Capillary: 334 mg/dL — ABNORMAL HIGH (ref 70–99)

## 2014-11-22 LAB — CBC
HCT: 41.4 % (ref 39.0–52.0)
Hemoglobin: 14.7 g/dL (ref 13.0–17.0)
MCH: 28.9 pg (ref 26.0–34.0)
MCHC: 35.5 g/dL (ref 30.0–36.0)
MCV: 81.3 fL (ref 78.0–100.0)
Platelets: 247 10*3/uL (ref 150–400)
RBC: 5.09 MIL/uL (ref 4.22–5.81)
RDW: 13.2 % (ref 11.5–15.5)
WBC: 18 10*3/uL — ABNORMAL HIGH (ref 4.0–10.5)

## 2014-11-22 LAB — HEPATIC FUNCTION PANEL
ALBUMIN: 3.6 g/dL (ref 3.5–5.2)
ALT: 39 U/L (ref 0–53)
AST: 23 U/L (ref 0–37)
Alkaline Phosphatase: 90 U/L (ref 39–117)
Total Bilirubin: 1.3 mg/dL — ABNORMAL HIGH (ref 0.3–1.2)
Total Protein: 6.1 g/dL (ref 6.0–8.3)

## 2014-11-22 LAB — MRSA PCR SCREENING: MRSA by PCR: NEGATIVE

## 2014-11-22 MED ORDER — INSULIN GLARGINE 100 UNIT/ML ~~LOC~~ SOLN
35.0000 [IU] | Freq: Every day | SUBCUTANEOUS | Status: DC
  Filled 2014-11-22: qty 0.35

## 2014-11-22 MED ORDER — INSULIN GLARGINE 100 UNIT/ML ~~LOC~~ SOLN
50.0000 [IU] | Freq: Every day | SUBCUTANEOUS | Status: DC
  Administered 2014-11-22: 50 [IU] via SUBCUTANEOUS
  Filled 2014-11-22 (×2): qty 0.5

## 2014-11-22 MED ORDER — INSULIN ASPART 100 UNIT/ML ~~LOC~~ SOLN
0.0000 [IU] | SUBCUTANEOUS | Status: DC
  Administered 2014-11-22: 2 [IU] via SUBCUTANEOUS
  Administered 2014-11-22: 3 [IU] via SUBCUTANEOUS

## 2014-11-22 NOTE — Progress Notes (Signed)
Utilization review completed.  

## 2014-11-22 NOTE — Progress Notes (Signed)
Shift Event: Called to transition pt off Insulin gtt. consective CBGs <250, Anion gap 10, CO2 20. Put an order for 50 U of Lantus with continuation of the Insulin gtt and to stop in 2 hrs. RN to call MD for further transition orders.    Illa LevelSahar Osman [facility]  [facility]

## 2014-11-22 NOTE — Progress Notes (Signed)
[facility] PROGRESS NOTE  Ruben Ruben Mosteller ZOX:[id number] DOB: 02/05/1981 DOA: 11/21/2014 PCP: No primary care provider on file.  Assessment/Plan:  DKA (diabetic ketoacidoses) -Secondary to patient trying to stretch is limited amount of insulin. -Counseled that as Ruben diabetic type I this could not happen again. -Consult to CSW for help with Levemir, NovoLog prior to discharge -Consult to CSW for help starting insurance paperwork -Consult Ruben Child psychotherapistsocial worker for help in obtaining PCP prior to discharge -Hemoglobin A1c pending -Lipid panel pending  Nausea vomiting -Resolved with resolution of DKA   Acute renal failure   - probably from dehydration. Improved with hydration   Tobacco abuse  - Counseled on abstinence    Code Status: Full Family Communication: None Disposition Plan: Discharge next 24 hours   Consultants: NA  Procedures: NA   Cultures NA  Antibiotics: NA   DVT prophylaxis Lovenox   HPI/Subjective: Ruben Ruben Ruben is Ruben 34 y.o. BM PMHx  diabetes mellitus type 1 present to the ER because of elevated blood sugar. Patient stated that since morning his blood sugar has been running high and also initially early in the morning patient also had 2-3 episodes of nausea and vomiting with frequent urination. He also felt mildly short of breath but denies any chest pain palpitation dizziness headache visual symptoms or loss of consciousness. Patient states his blood sugar went up and go 500 and in the ER was found to be around 285 with anion gap and low bicarbonate. Patient has been started on IV insulin infusion and admitted for diabetic ketoacidosis. Patient states he has been compliant with his medications. On exam patient abdomen appears benign. Patient is afebrile.  2/21 states he was trying to stretch the insulin out not realizing this would put him into DKA. States currently has no PCP, insurance.     Objective: Filed Vitals:   11/22/14 0705 11/22/14 1045  11/22/14 1520 11/22/14 1525  BP: 115/79 130/64  131/71  Pulse: 89 88    Temp: 98.5 F (36.9 C) 98.8 F (37.1 C) 98.1 F (36.7 C)   TempSrc: Oral Oral Oral   Resp: 14 19  16   Height:      Weight:      SpO2: 100% 100%      Intake/Output Summary (Last 24 hours) at 11/22/14 1920 Last data filed at 11/22/14 1800  Gross per 24 hour  Intake    500 ml  Output   1600 ml  Net  -1100 ml   Filed Weights   11/21/14 2144  Weight: 57.4 kg (126 lb 8.7 oz)     Exam: General: Ruben/O 4, NAD, No acute respiratory distress Lungs: Clear to auscultation bilaterally without wheezes or crackles Cardiovascular: Regular rate and rhythm without murmur gallop or rub normal S1 and S2 Abdomen: Nontender, nondistended, soft, bowel sounds positive, no rebound, no ascites, no appreciable mass Extremities: No significant cyanosis, clubbing, or edema bilateral lower extremities   Data Reviewed: Basic Metabolic Panel:  Recent Labs Lab 11/21/14 1746 11/21/14 2240 11/22/14 0204 11/22/14 0740  NA 134* 134* 135 137  K 4.5 4.8 4.2 3.9  CL 99 100 105 105  CO2 14* 16* 20 22  GLUCOSE 285* 349* 183* 138*  BUN 18 16 17 12   CREATININE 1.40* 1.37* 1.21 1.06  CALCIUM 9.7 9.2 9.0 8.8   Liver Function Tests:  Recent Labs Lab 11/21/14 1746 11/22/14 0204  AST 37 23  ALT 50 39  ALKPHOS 109 90  BILITOT 1.6* 1.3*  PROT  7.7 6.1  ALBUMIN 4.5 3.6   No results for input(s): LIPASE, AMYLASE in the last 168 hours. No results for input(s): AMMONIA in the last 168 hours. CBC:  Recent Labs Lab 11/21/14 1746 11/21/14 2240 11/22/14 0204  WBC 19.4* 18.0* 17.7*  NEUTROABS  --   --  14.4*  HGB 16.2 14.7 13.2  HCT 46.2 41.4 38.3*  MCV 84.5 81.3 80.5  PLT 99* 247 236   Cardiac Enzymes:  Recent Labs Lab 11/21/14 2240  TROPONINI 0.03   BNP (last 3 results) No results for input(s): BNP in the last 8760 hours.  ProBNP (last 3 results) No results for input(s): PROBNP in the last 8760  hours.  CBG:  Recent Labs Lab 11/22/14 0737 11/22/14 0846 11/22/14 1004 11/22/14 1230 11/22/14 1524  GLUCAP 131* 194* 192* 180* 129*    Recent Results (from the past 240 hour(s))  MRSA PCR Screening     Status: None   Collection Time: 11/22/14  5:42 AM  Result Value Ref Range Status   MRSA by PCR NEGATIVE NEGATIVE Final    Comment:        The GeneXpert MRSA Assay (FDA approved for NASAL specimens only), is one component of Ruben comprehensive MRSA colonization surveillance program. It is not intended to diagnose MRSA infection nor to guide or monitor treatment for MRSA infections.      Studies: Portable Chest X-ray (1 View)  11/21/2014   CLINICAL DATA:  Dyspnea  EXAM: PORTABLE CHEST - 1 VIEW  COMPARISON:  None.  FINDINGS: Ruben single AP portable view of the chest demonstrates no focal airspace consolidation or alveolar edema. The lungs are grossly clear. There is no large effusion or pneumothorax. Cardiac and mediastinal contours appear unremarkable.  IMPRESSION: No active disease.   Electronically Signed   By: Ellery Plunk M.D.   On: 11/21/2014 22:12    Scheduled Meds: . enoxaparin (LOVENOX) injection  40 mg Subcutaneous QHS  . insulin aspart  0-15 Units Subcutaneous 6 times per day  . insulin glargine  50 Units Subcutaneous Daily   Continuous Infusions: . sodium chloride Stopped (11/22/14 0113)  . dextrose 5 % and 0.45% NaCl 100 mL/hr at 11/22/14 0112  . insulin (NOVOLIN-R) infusion 2.6 Units/hr (11/22/14 1004)    Active Problems:   Diabetic ketoacidosis   Acute renal failure   DKA (diabetic ketoacidoses)   Diabetic ketoacidosis without coma associated with other specified diabetes mellitus   SOB (shortness of breath)   Nausea with vomiting   Tobacco abuse    Time spent: 40 minutes    WOODS, CURTIS, J  [facility] Pager [phone]. If 7PM-7AM, please contact night-coverage at [email or web address], password [facility] 11/22/2014, 7:20 PM  LOS: 1 day    Care  during the described time interval was provided by me .  I have reviewed this patient's available data, including medical history, events of note, physical examination, radiology studies and test results as part of my evaluation  Carolyne Littles, MD [phone] Pager

## 2014-11-23 LAB — BASIC METABOLIC PANEL
Anion gap: 5 (ref 5–15)
BUN: 7 mg/dL (ref 6–23)
CO2: 26 mmol/L (ref 19–32)
CREATININE: 0.89 mg/dL (ref 0.50–1.35)
Calcium: 8.5 mg/dL (ref 8.4–10.5)
Chloride: 106 mmol/L (ref 96–112)
GFR calc Af Amer: >90 mL/min (ref >90)
GLUCOSE: 108 mg/dL — AB (ref 70–99)
POTASSIUM: 3 mmol/L — AB (ref 3.5–5.1)
Sodium: 137 mmol/L (ref 135–145)

## 2014-11-23 LAB — GLUCOSE, CAPILLARY
GLUCOSE-CAPILLARY: 57 mg/dL — AB (ref 70–99)
Glucose-Capillary: 125 mg/dL — ABNORMAL HIGH (ref 70–99)
Glucose-Capillary: 149 mg/dL — ABNORMAL HIGH (ref 70–99)
Glucose-Capillary: 299 mg/dL — ABNORMAL HIGH (ref 70–99)
Glucose-Capillary: 61 mg/dL — ABNORMAL LOW (ref 70–99)
Glucose-Capillary: 99 mg/dL (ref 70–99)

## 2014-11-23 LAB — MAGNESIUM: MAGNESIUM: 1.9 mg/dL (ref 1.5–2.5)

## 2014-11-23 LAB — CBC
HCT: 38.3 % — ABNORMAL LOW (ref 39.0–52.0)
Hemoglobin: 13.2 g/dL (ref 13.0–17.0)
MCH: 27.8 pg (ref 26.0–34.0)
MCHC: 34.5 g/dL (ref 30.0–36.0)
MCV: 80.6 fL (ref 78.0–100.0)
PLATELETS: 215 10*3/uL (ref 150–400)
RBC: 4.75 MIL/uL (ref 4.22–5.81)
RDW: 13 % (ref 11.5–15.5)
WBC: 9.5 10*3/uL (ref 4.0–10.5)

## 2014-11-23 LAB — HEMOGLOBIN A1C
Hgb A1c MFr Bld: 12.4 % — ABNORMAL HIGH (ref 4.8–5.6)
Mean Plasma Glucose: 309 mg/dL

## 2014-11-23 LAB — LIPID PANEL
Cholesterol: 216 mg/dL — ABNORMAL HIGH (ref 0–200)
HDL: 27 mg/dL — ABNORMAL LOW (ref >39)
LDL Cholesterol: 141 mg/dL — ABNORMAL HIGH (ref 0–99)
Total CHOL/HDL Ratio: 8 RATIO
Triglycerides: 238 mg/dL — ABNORMAL HIGH (ref <150)
VLDL: 48 mg/dL — ABNORMAL HIGH (ref 0–40)

## 2014-11-23 MED ORDER — INSULIN ASPART 100 UNIT/ML ~~LOC~~ SOLN: 0.0000 [IU] | Freq: Every day | SUBCUTANEOUS | Status: DC

## 2014-11-23 MED ORDER — INSULIN ASPART 100 UNIT/ML ~~LOC~~ SOLN: 0.0000 [IU] | Freq: Three times a day (TID) | SUBCUTANEOUS | Status: DC

## 2014-11-23 MED ORDER — POTASSIUM CHLORIDE CRYS ER 20 MEQ PO TBCR
40.0000 meq | EXTENDED_RELEASE_TABLET | Freq: Once | ORAL | Status: AC
  Administered 2014-11-23: 40 meq via ORAL
  Filled 2014-11-23: qty 2

## 2014-11-23 MED ORDER — POTASSIUM CHLORIDE 10 MEQ/100ML IV SOLN
10.0000 meq | Freq: Once | INTRAVENOUS | Status: AC
  Administered 2014-11-23: 10 meq via INTRAVENOUS
  Filled 2014-11-23: qty 100

## 2014-11-23 MED ORDER — INSULIN ASPART PROT & ASPART (70-30 MIX) 100 UNIT/ML ~~LOC~~ SUSP: 30.0000 [IU] | Freq: Two times a day (BID) | SUBCUTANEOUS | Status: DC

## 2014-11-23 MED ORDER — INSULIN ASPART PROT & ASPART (70-30 MIX) 100 UNIT/ML ~~LOC~~ SUSP
30.0000 [IU] | Freq: Two times a day (BID) | SUBCUTANEOUS | Status: DC
  Filled 2014-11-23: qty 10

## 2014-11-23 NOTE — Progress Notes (Signed)
Medicare Important Message given? YES  (If response is "NO", the following Medicare IM given date fields will be blank)  Date Medicare IM given: 11/23/14 Medicare IM given by:  Berkley Cronkright  

## 2014-11-23 NOTE — Discharge Summary (Signed)
Physician Discharge Summary  Ruben [name] NUU:[id number] DOB: 08/04/1981 DOA: 11/21/2014  PCP: No primary care provider on file.  Admit date: 11/21/2014 Discharge date: 11/23/2014  Recommendations for Outpatient Follow-up:  1. Pt will need to follow up [facility] 11/25/2014-- 0845AM 2. Please obtain BMP   Discharge Diagnoses:  DKA (diabetic ketoacidoses) -Secondary to patient trying to stretch is limited amount of insulin. -Counseled that as a diabetic type I this could not happen again. -Given the patient's financial situation and insurance situation, switch the patient to 70/30 insulin which the patient states that he is able to afford -I told the patient that he can get his 70/30 insulin at reduced cost at the [facility] health and wellness clinic as well as at [facility]. -70/30 insulin--30 units bid -Patient was instructed to check his CBGs before each meal and at bedtime and to keep her glycemic load with which he will give to his primary care provider -Follow up outpatient appointment was set up at the [facility] health and wellness clinic 11/25/2014 at 8:45 AM -Patient states that he has a glucometer and all the testing supplies Nausea vomiting -Resolved with resolution of DKA   Acute renal failure  - probably from dehydration. Improved with hydration  -Resolved--serum creatinine 0.89 on the day of discharge Tobacco abuse  - Counseled on abstinence   Discharge Condition: Stable  Disposition: Home Follow-up Information    Follow up with [facility]     On 11/25/2014.   Why:  f/u appointment 9:00 (please arrive at 8:45 with photo ID, medications, and $20 copay)   Contact information:   [address] [facility] [address] [phone]      Diet:carb modified Wt Readings from Last 3 Encounters:  11/21/14 57.4 kg (126 lb 8.7 oz)  09/18/14 68.04 kg (150 lb)    History of present illness:  34 y.o. male  with history of diabetes mellitus type 1 present to the ER because of elevated blood sugar. Patient stated that since morning his blood sugar has been running high and also initially early in the morning patient also had 2-3 episodes of nausea and vomiting with frequent urination. He also felt mildly short of breath but denies any chest pain palpitation dizziness headache visual symptoms or loss of consciousness. Patient states his blood sugar went up and go 500 and in the ER he was found have an anion gap of 21. The patient was started on IV fluids and intravenous insulin. He responded well clinically. His nausea and vomiting improved. His diet was advanced which he tolerated. The patient was transitioned to subcutaneous insulin which she tolerated. The patient was set up with an outpatient appointment at the [facility], health and wellness clinic.   Discharge Exam: Filed Vitals:   11/23/14 0747  BP:   Pulse:   Temp: 97.9 F (36.6 C)  Resp:    Filed Vitals:   11/22/14 1917 11/22/14 2323 11/23/14 0334 11/23/14 0747  BP: 127/77 129/68    Pulse: 70 74    Temp: 98.6 F (37 C) 98.8 F (37.1 C) 98.4 F (36.9 C) 97.9 F (36.6 C)  TempSrc: Oral Oral Oral Oral  Resp: 15 14    Height:      Weight:      SpO2: 100% 100%     General: A&O x 3, NAD, pleasant, cooperative Cardiovascular: RRR, no rub, no gallop, no S3 Respiratory: CTAB, no wheeze, no rhonchi Abdomen:soft, nontender, nondistended,  positive bowel sounds Extremities: No edema, No lymphangitis, no petechiae  Discharge Instructions     Medication List    STOP taking these medications        doxycycline 100 MG capsule  Commonly known as:  VIBRAMYCIN     HYDROcodone-acetaminophen 5-325 MG per tablet  Commonly known as:  NORCO     insulin detemir 100 UNIT/ML injection  Commonly known as:  LEVEMIR     insulin regular 100 units/mL injection  Commonly known as:  NOVOLIN R,HUMULIN R     naproxen 500 MG tablet  Commonly known as:   NAPROSYN      TAKE these medications        insulin aspart protamine- aspart (70-30) 100 UNIT/ML injection  Commonly known as:  NOVOLOG MIX 70/30  Inject 0.3 mLs (30 Units total) into the skin 2 (two) times daily with a meal.         The results of significant diagnostics from this hospitalization (including imaging, microbiology, ancillary and laboratory) are listed below for reference.    Significant Diagnostic Studies: Portable Chest X-ray (1 View)  11/21/2014   CLINICAL DATA:  Dyspnea  EXAM: PORTABLE CHEST - 1 VIEW  COMPARISON:  None.  FINDINGS: A single AP portable view of the chest demonstrates no focal airspace consolidation or alveolar edema. The lungs are grossly clear. There is no large effusion or pneumothorax. Cardiac and mediastinal contours appear unremarkable.  IMPRESSION: No active disease.   Electronically Signed   By: Ellery Plunkaniel R Mitchell M.D.   On: 11/21/2014 22:12     Microbiology: Recent Results (from the past 240 hour(s))  MRSA PCR Screening     Status: None   Collection Time: 11/22/14  5:42 AM  Result Value Ref Range Status   MRSA by PCR NEGATIVE NEGATIVE Final    Comment:        The GeneXpert MRSA Assay (FDA approved for NASAL specimens only), is one component of a comprehensive MRSA colonization surveillance program. It is not intended to diagnose MRSA infection nor to guide or monitor treatment for MRSA infections.      Labs: Basic Metabolic Panel:  Recent Labs Lab 11/21/14 1746 11/21/14 2240 11/22/14 0204 11/22/14 0740 11/23/14 0224  NA 134* 134* 135 137 137  K 4.5 4.8 4.2 3.9 3.0*  CL 99 100 105 105 106  CO2 14* 16* 20 22 26   GLUCOSE 285* 349* 183* 138* 108*  BUN 18 16 17 12 7   CREATININE 1.40* 1.37* 1.21 1.06 0.89  CALCIUM 9.7 9.2 9.0 8.8 8.5  MG  --   --   --   --  1.9   Liver Function Tests:  Recent Labs Lab 11/21/14 1746 11/22/14 0204  AST 37 23  ALT 50 39  ALKPHOS 109 90  BILITOT 1.6* 1.3*  PROT 7.7 6.1  ALBUMIN 4.5  3.6   No results for input(s): LIPASE, AMYLASE in the last 168 hours. No results for input(s): AMMONIA in the last 168 hours. CBC:  Recent Labs Lab 11/21/14 1746 11/21/14 2240 11/22/14 0204 11/23/14 0224  WBC 19.4* 18.0* 17.7* 9.5  NEUTROABS  --   --  14.4*  --   HGB 16.2 14.7 13.2 13.2  HCT 46.2 41.4 38.3* 38.3*  MCV 84.5 81.3 80.5 80.6  PLT 99* 247 236 215   Cardiac Enzymes:  Recent Labs Lab 11/21/14 2240  TROPONINI 0.03   BNP: Invalid input(s): POCBNP CBG:  Recent Labs Lab 11/22/14 1524 11/22/14 1921 11/22/14 2311 11/23/14  0019 11/23/14 0315  GLUCAP 129* 110* 61* 149* 99    Time coordinating discharge:  Greater than 30 minutes  Signed:  Fredericka Bottcher, DO [facility] Pager: [phone] 11/23/2014, 11:17 AM

## 2014-11-23 NOTE — Progress Notes (Signed)
Discharge instructions given to patient with teach-back.  All questions answered.  No complaints.

## 2014-11-23 NOTE — Clinical Social Work Note (Signed)
CSW Consult Acknowledged:   CSW received a consult med/PCP assistant and Medicaid application. CSW will inform the financial counselor regarding Medicaid application and the case manager regarding med/PCP assistant. At this time there are no additional needs CSW will sign off.      Bertha Earwood, MSW, LCSWA [phone]

## 2014-11-23 NOTE — Care Management Note (Unsigned)
    Page 1 of 1   11/23/2014     11:26:20 AM CARE MANAGEMENT NOTE 11/23/2014  Patient:  Ruben [name],Ruben [name]   Account Number:  [id number]  Date Initiated:  11/23/2014  Documentation initiated by:  [name],Ruben  Subjective/Objective Assessment:   PTA from home admitted with DKA.     Action/Plan:   Return to home once stable.   Anticipated DC Date:  11/23/2014   Anticipated DC Plan:  HOME/SELF CARE      DC Planning Services  CM consult  [facility]      Choice offered to / List presented to:             Status of service:  Completed, signed off Medicare Important Message given?  NO (If response is "NO", the following Medicare IM given date fields will be blank) Date Medicare IM given:   Medicare IM given by:   Date Additional Medicare IM given:   Additional Medicare IM given by:    Discharge Disposition:  HOME/SELF CARE  Per UR Regulation:  Reviewed for med. necessity/level of care/duration of stay  If discussed at Long Length of Stay Meetings, dates discussed:    Comments:  11/23/2014 @ 11:00 Ruben GallopAngela Cole RN,BSN Pt  admitted  with no insurance, medicaid application pending. F/[facility] on 11/25/2014 @ 0900. Informed pt to bring photo id, current medications, and $ 20.00 copay. Pt stated reinforcement  per  MD  of medication cost with [facility] $ 25.00 fpr novolog.No futher needs identified.

## 2014-11-25 ENCOUNTER — Encounter: Payer: Self-pay | Admitting: Family Medicine

## 2014-11-25 ENCOUNTER — Ambulatory Visit: Payer: MEDICAID | Attending: Family Medicine | Admitting: Family Medicine

## 2014-11-25 VITALS — BP 125/86 | HR 87 | Temp 98.2°F | Resp 16 | Ht 70.0 in | Wt 135.0 lb

## 2014-11-25 DIAGNOSIS — Z720 Tobacco use: Secondary | ICD-10-CM

## 2014-11-25 DIAGNOSIS — E1010 Type 1 diabetes mellitus with ketoacidosis without coma: Secondary | ICD-10-CM | POA: Insufficient documentation

## 2014-11-25 DIAGNOSIS — F17200 Nicotine dependence, unspecified, uncomplicated: Secondary | ICD-10-CM | POA: Insufficient documentation

## 2014-11-25 DIAGNOSIS — IMO0002 Reserved for concepts with insufficient information to code with codable children: Secondary | ICD-10-CM

## 2014-11-25 DIAGNOSIS — E1065 Type 1 diabetes mellitus with hyperglycemia: Secondary | ICD-10-CM

## 2014-11-25 DIAGNOSIS — E1310 Other specified diabetes mellitus with ketoacidosis without coma: Secondary | ICD-10-CM

## 2014-11-25 DIAGNOSIS — Z114 Encounter for screening for human immunodeficiency virus [HIV]: Secondary | ICD-10-CM | POA: Insufficient documentation

## 2014-11-25 DIAGNOSIS — E785 Hyperlipidemia, unspecified: Secondary | ICD-10-CM | POA: Insufficient documentation

## 2014-11-25 LAB — POCT URINALYSIS DIPSTICK
Bilirubin, UA: NEGATIVE
Blood, UA: NEGATIVE
GLUCOSE UA: 250
Ketones, UA: NEGATIVE
LEUKOCYTES UA: NEGATIVE
Nitrite, UA: NEGATIVE
Protein, UA: NEGATIVE
SPEC GRAV UA: 1.025
UROBILINOGEN UA: 1
pH, UA: 6

## 2014-11-25 LAB — GLUCOSE, POCT (MANUAL RESULT ENTRY): POC Glucose: 130 mg/dl — AB (ref 70–99)

## 2014-11-25 MED ORDER — "INSULIN SYRINGE-NEEDLE U-100 31G X 5/16"" 0.5 ML MISC": 1.0000 | Freq: Four times a day (QID) | Status: AC

## 2014-11-25 MED ORDER — INSULIN ASPART 100 UNIT/ML ~~LOC~~ SOLN: 10.0000 [IU] | Freq: Three times a day (TID) | SUBCUTANEOUS | Status: AC

## 2014-11-25 MED ORDER — INSULIN GLARGINE 100 UNIT/ML ~~LOC~~ SOLN: 30.0000 [IU] | Freq: Every day | SUBCUTANEOUS | Status: AC

## 2014-11-25 MED ORDER — TRUERESULT BLOOD GLUCOSE W/DEVICE KIT: 1.0000 | PACK | Freq: Three times a day (TID) | Status: AC

## 2014-11-25 MED ORDER — TRUEPLUS LANCETS 28G MISC: 1.0000 | Freq: Three times a day (TID) | Status: AC

## 2014-11-25 MED ORDER — GLUCOSE BLOOD VI STRP: 1.0000 | ORAL_STRIP | Freq: Three times a day (TID) | Status: AC

## 2014-11-25 MED ORDER — ATORVASTATIN CALCIUM 40 MG PO TABS: 40.0000 mg | ORAL_TABLET | Freq: Every day | ORAL | Status: AC

## 2014-11-25 NOTE — Assessment & Plan Note (Addendum)
1. Diabetes type 1 Goal A1c < 7 novolog 10 U three times daily lantus start with 30 U, then increase by 5 U every day for fasting >100 up to 50 U   Diabetes  Check blood sugar day fasting (3 times per day) and before meals  Goal fasting 100  Goal after eating < 160 Beware of hypoglycemia (low blood sugar) which is blood sugar < 70 with or without symptoms

## 2014-11-25 NOTE — Assessment & Plan Note (Signed)
Screening HIV ordered  

## 2014-11-25 NOTE — Patient Instructions (Addendum)
Mr. Anise SalvoReeves,  Thank you for coming in today. It was a pleasure meeting you. I look forward to being your primary doctor.  1. Diabetes type 1 Goal A1c < 7 novolog 10 U three times daily lantus start with 30 U, then increase by 5 U every day for fasting >100 up to 50 U   Diabetes  Check blood sugar day fasting (3 times per day) and before meals  Goal fasting 100  Goal after eating < 160 Beware of hypoglycemia (low blood sugar) which is blood sugar < 70 with or without symptoms  2. High cholesterol: Start lipitor 40 mg daily   3. Smoking cessation support: smoking cessation hotline: 1-800-QUIT-NOW.  Smoking cessation classes are available through [facility]. Call [phone] or visit our website at [email or web address].  You will be called with lab results   F/u with RN for blood sugar review in 3 weeks  F/u with me in 3 months   Dr. Armen PickupFunches

## 2014-11-25 NOTE — Progress Notes (Signed)
Patient here for HFU following episode of DKA Patient states he did not take his insulin this am

## 2014-11-25 NOTE — Assessment & Plan Note (Signed)
High cholesterol: Start lipitor 40 mg daily

## 2014-11-25 NOTE — Assessment & Plan Note (Signed)
Smoking cessation support: smoking cessation hotline: 1-800-QUIT-NOW.  Smoking cessation classes are available through  [facility]. Call 336-832-9999 or visit our website at [email or web address].   

## 2014-11-25 NOTE — Assessment & Plan Note (Signed)
Resolved with insulin and IV fluids, no recurrence

## 2014-11-25 NOTE — Progress Notes (Signed)
   Subjective:    Patient ID: Ruben [name], male    DOB: 04/10/1981, 34 y.o.   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 DKA: hospitalized from 2/20-2/22/16. DKA due to lack of insulin. Prescribed 70/30 too expensive. Has been on novolin R, novolog, lantus, levemir in the past. Feeling well today. No CP, SOB, GI upset, polyuria or polydipsia.   Soc HK:[id number]:smoker  Med Hx: IDDM type 1 dx in  Surg Hx: negative  Review of Systems As per HPI     Objective:   Physical Exam BP 125/86 mmHg  Pulse 87  Temp(Src) 98.2 F (36.8 C)  Resp 16  Ht 5\' 10"  (1.778 m)  Wt 135 lb (61.236 kg)  BMI 19.37 kg/m2  SpO2 100% General appearance: alert, cooperative and no distress Throat: normal findings: lips normal without lesions, buccal mucosa normal and oropharynx pink & moist without lesions or evidence of thrush and abnormal findings: dentition: multiple carries especially in the molars  Neck: no adenopathy, supple, symmetrical, trachea midline and thyroid not enlarged, symmetric, no tenderness/mass/nodules Lungs: clear to auscultation bilaterally Heart: regular rate and rhythm, S1, S2 normal, no murmur, click, rub or gallop Extremities: extremities normal, atraumatic, no cyanosis or edema  Lab Results  Component Value Date   HGBA1C 12.4* 11/21/2014   CBG 130 UA: no     Assessment & Plan:

## 2014-11-26 LAB — BASIC METABOLIC PANEL WITH GFR
BUN: 7 mg/dL (ref 6–23)
CO2: 27 meq/L (ref 19–32)
Calcium: 9.9 mg/dL (ref 8.4–10.5)
Chloride: 103 meq/L (ref 96–112)
Creat: 0.83 mg/dL (ref 0.50–1.35)
Glucose, Bld: 132 mg/dL — ABNORMAL HIGH (ref 70–99)
Potassium: 4.1 meq/L (ref 3.5–5.3)
Sodium: 141 meq/L (ref 135–145)

## 2014-11-26 LAB — MICROALBUMIN / CREATININE URINE RATIO
Creatinine, Urine: 188.7 mg/dL
Microalb Creat Ratio: 12.2 mg/g (ref 0.0–30.0)
Microalb, Ur: 2.3 mg/dL — ABNORMAL HIGH

## 2014-11-26 LAB — HIV ANTIBODY (ROUTINE TESTING W REFLEX): HIV 1&2 Ab, 4th Generation: NONREACTIVE

## 2014-11-27 ENCOUNTER — Telehealth: Payer: Self-pay | Admitting: *Deleted

## 2014-11-27 NOTE — Telephone Encounter (Signed)
-----   Message from Lora PaulaJosalyn C Funches, MD sent at 11/26/2014  9:12 AM EST ----- Normal labs Including negative screening HIV  Normal BMP

## 2014-11-27 NOTE — Telephone Encounter (Signed)
Left voice message with normal labs If any question return call 

## 2015-12-10 IMAGING — CR DG CHEST 1V PORT
1 series · 1 of 1 positions shown · non-contrast
Comparison: None.

CLINICAL DATA: Dyspnea

EXAM:
PORTABLE CHEST - 1 VIEW

[AP]
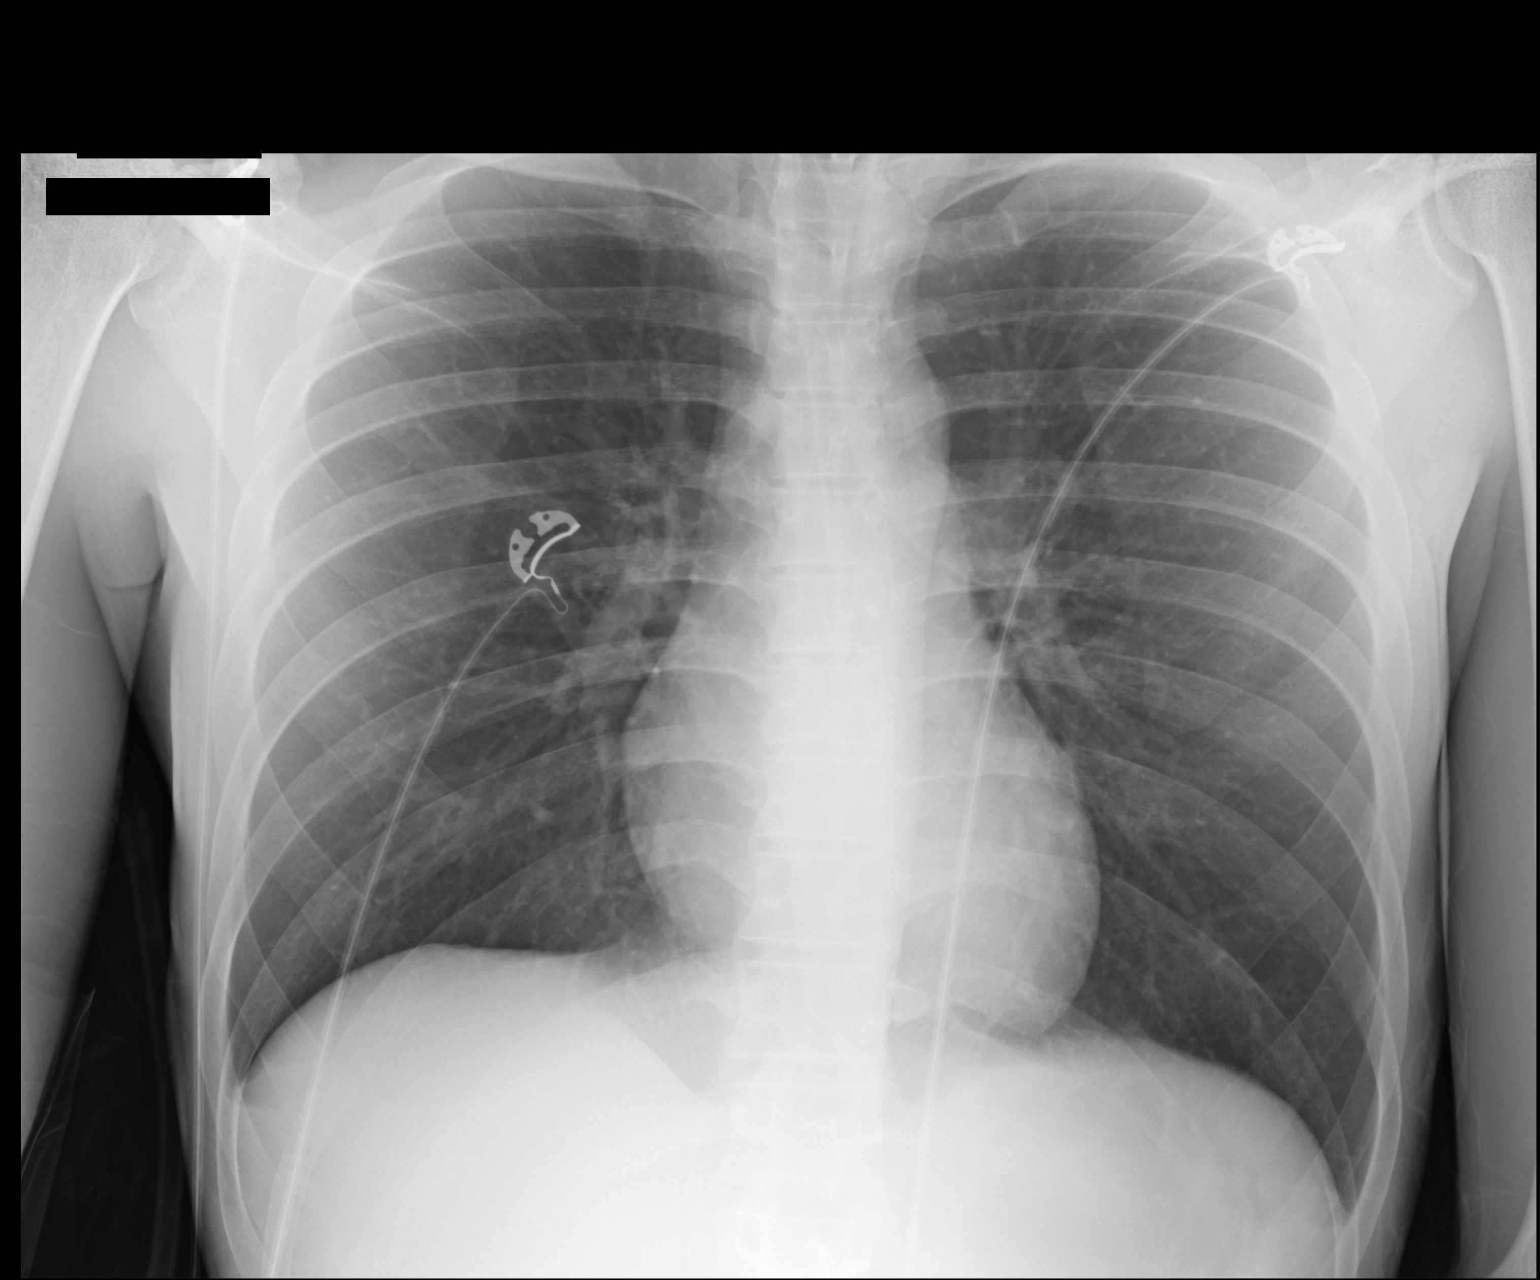

[1 of 1 positions shown; findings below may reference images not displayed]

FINDINGS: A single AP portable view of the chest demonstrates no focal
airspace consolidation or alveolar edema. The lungs are grossly
clear. There is no large effusion or pneumothorax. Cardiac and
mediastinal contours appear unremarkable.
IMPRESSION: No active disease.
